# Patient Record
Sex: Female | Born: 1940 | Race: White | Hispanic: No | Marital: Married | Smoking: Never smoker
Health system: Southern US, Community
[De-identification: ages and names within clinical notes are randomized; demographics above are authoritative.]

## PROBLEM LIST (undated history)

## (undated) DIAGNOSIS — C801 Malignant (primary) neoplasm, unspecified: Secondary | ICD-10-CM

## (undated) DIAGNOSIS — I10 Essential (primary) hypertension: Secondary | ICD-10-CM

## (undated) DIAGNOSIS — C50919 Malignant neoplasm of unspecified site of unspecified female breast: Secondary | ICD-10-CM

## (undated) DIAGNOSIS — Z923 Personal history of irradiation: Secondary | ICD-10-CM

## (undated) DIAGNOSIS — E785 Hyperlipidemia, unspecified: Secondary | ICD-10-CM

## (undated) DIAGNOSIS — K635 Polyp of colon: Secondary | ICD-10-CM

## (undated) HISTORY — DX: Malignant (primary) neoplasm, unspecified: C80.1

## (undated) HISTORY — DX: Hyperlipidemia, unspecified: E78.5

## (undated) HISTORY — DX: Essential (primary) hypertension: I10

## (undated) HISTORY — DX: Polyp of colon: K63.5

## (undated) HISTORY — PX: BREAST LUMPECTOMY: SHX2

## (undated) HISTORY — PX: BREAST BIOPSY: SHX20

---

## 1998-08-10 ENCOUNTER — Other Ambulatory Visit: Admission: RE | Admit: 1998-08-10 | Discharge: 1998-08-10 | Payer: Self-pay | Admitting: Obstetrics and Gynecology

## 1998-11-19 ENCOUNTER — Ambulatory Visit (HOSPITAL_COMMUNITY): Admission: RE | Admit: 1998-11-19 | Discharge: 1998-11-19 | Payer: Self-pay | Admitting: Gastroenterology

## 1999-12-10 ENCOUNTER — Encounter: Payer: Self-pay | Admitting: Oncology

## 1999-12-10 ENCOUNTER — Encounter: Admission: RE | Admit: 1999-12-10 | Discharge: 1999-12-10 | Payer: Self-pay | Admitting: Oncology

## 2000-12-11 ENCOUNTER — Encounter: Payer: Self-pay | Admitting: Oncology

## 2000-12-11 ENCOUNTER — Encounter: Admission: RE | Admit: 2000-12-11 | Discharge: 2000-12-11 | Payer: Self-pay | Admitting: Oncology

## 2001-07-02 ENCOUNTER — Other Ambulatory Visit: Admission: RE | Admit: 2001-07-02 | Discharge: 2001-07-02 | Payer: Self-pay | Admitting: Family Medicine

## 2001-12-14 ENCOUNTER — Encounter: Admission: RE | Admit: 2001-12-14 | Discharge: 2001-12-14 | Payer: Self-pay | Admitting: Oncology

## 2001-12-14 ENCOUNTER — Encounter: Payer: Self-pay | Admitting: Oncology

## 2002-08-05 ENCOUNTER — Other Ambulatory Visit: Admission: RE | Admit: 2002-08-05 | Discharge: 2002-08-05 | Payer: Self-pay | Admitting: Family Medicine

## 2002-12-16 ENCOUNTER — Encounter: Admission: RE | Admit: 2002-12-16 | Discharge: 2002-12-16 | Payer: Self-pay | Admitting: Oncology

## 2002-12-16 ENCOUNTER — Encounter: Payer: Self-pay | Admitting: Oncology

## 2003-08-19 ENCOUNTER — Other Ambulatory Visit: Admission: RE | Admit: 2003-08-19 | Discharge: 2003-08-19 | Payer: Self-pay | Admitting: Family Medicine

## 2004-01-12 ENCOUNTER — Encounter: Admission: RE | Admit: 2004-01-12 | Discharge: 2004-01-12 | Payer: Self-pay | Admitting: Family Medicine

## 2004-02-02 ENCOUNTER — Ambulatory Visit (HOSPITAL_COMMUNITY): Admission: RE | Admit: 2004-02-02 | Discharge: 2004-02-02 | Payer: Self-pay | Admitting: Gastroenterology

## 2004-02-02 ENCOUNTER — Encounter (INDEPENDENT_AMBULATORY_CARE_PROVIDER_SITE_OTHER): Payer: Self-pay | Admitting: *Deleted

## 2005-01-28 ENCOUNTER — Encounter: Admission: RE | Admit: 2005-01-28 | Discharge: 2005-01-28 | Payer: Self-pay | Admitting: Family Medicine

## 2005-11-29 ENCOUNTER — Other Ambulatory Visit: Admission: RE | Admit: 2005-11-29 | Discharge: 2005-11-29 | Payer: Self-pay | Admitting: Family Medicine

## 2006-02-15 ENCOUNTER — Encounter: Admission: RE | Admit: 2006-02-15 | Discharge: 2006-02-15 | Payer: Self-pay | Admitting: Family Medicine

## 2007-02-19 ENCOUNTER — Encounter: Admission: RE | Admit: 2007-02-19 | Discharge: 2007-02-19 | Payer: Self-pay | Admitting: Family Medicine

## 2007-12-13 LAB — CONVERTED CEMR LAB: Pap Smear: NORMAL

## 2007-12-25 ENCOUNTER — Other Ambulatory Visit: Admission: RE | Admit: 2007-12-25 | Discharge: 2007-12-25 | Payer: Self-pay | Admitting: Family Medicine

## 2008-02-27 ENCOUNTER — Encounter: Admission: RE | Admit: 2008-02-27 | Discharge: 2008-02-27 | Payer: Self-pay | Admitting: Family Medicine

## 2009-02-19 ENCOUNTER — Encounter: Payer: Self-pay | Admitting: Family Medicine

## 2009-03-10 ENCOUNTER — Encounter: Admission: RE | Admit: 2009-03-10 | Discharge: 2009-03-10 | Payer: Self-pay | Admitting: Family Medicine

## 2010-02-09 ENCOUNTER — Ambulatory Visit: Payer: Self-pay | Admitting: Family Medicine

## 2010-02-09 DIAGNOSIS — E7800 Pure hypercholesterolemia, unspecified: Secondary | ICD-10-CM

## 2010-02-09 DIAGNOSIS — I10 Essential (primary) hypertension: Secondary | ICD-10-CM | POA: Insufficient documentation

## 2010-02-09 DIAGNOSIS — E559 Vitamin D deficiency, unspecified: Secondary | ICD-10-CM

## 2010-02-09 LAB — CONVERTED CEMR LAB: Vit D, 25-Hydroxy: 31 ng/mL (ref 30–89)

## 2010-02-10 LAB — CONVERTED CEMR LAB
BUN: 13 mg/dL (ref 6–23)
Calcium: 10.6 mg/dL — ABNORMAL HIGH (ref 8.4–10.5)
Creatinine, Ser: 0.8 mg/dL (ref 0.4–1.2)
HDL: 33.4 mg/dL — ABNORMAL LOW (ref >39.00)
LDL Cholesterol: 103 mg/dL — ABNORMAL HIGH (ref 0–99)
Total CHOL/HDL Ratio: 5
VLDL: 26.6 mg/dL (ref 0.0–40.0)

## 2010-02-15 ENCOUNTER — Ambulatory Visit: Payer: Self-pay | Admitting: Family Medicine

## 2010-02-15 DIAGNOSIS — Z860100 Personal history of colon polyps, unspecified: Secondary | ICD-10-CM | POA: Insufficient documentation

## 2010-02-15 DIAGNOSIS — L821 Other seborrheic keratosis: Secondary | ICD-10-CM

## 2010-02-15 DIAGNOSIS — M899 Disorder of bone, unspecified: Secondary | ICD-10-CM

## 2010-02-15 DIAGNOSIS — Z853 Personal history of malignant neoplasm of breast: Secondary | ICD-10-CM | POA: Insufficient documentation

## 2010-02-15 DIAGNOSIS — E785 Hyperlipidemia, unspecified: Secondary | ICD-10-CM

## 2010-02-15 DIAGNOSIS — M949 Disorder of cartilage, unspecified: Secondary | ICD-10-CM

## 2010-02-22 ENCOUNTER — Ambulatory Visit: Payer: Self-pay | Admitting: Family Medicine

## 2010-02-22 ENCOUNTER — Other Ambulatory Visit: Admission: RE | Admit: 2010-02-22 | Discharge: 2010-02-22 | Payer: Self-pay | Admitting: Family Medicine

## 2010-02-25 ENCOUNTER — Encounter: Payer: Self-pay | Admitting: Family Medicine

## 2010-02-25 LAB — CONVERTED CEMR LAB: Pap Smear: NEGATIVE

## 2010-03-08 ENCOUNTER — Encounter: Admission: RE | Admit: 2010-03-08 | Discharge: 2010-03-08 | Payer: Self-pay | Admitting: Family Medicine

## 2010-03-12 ENCOUNTER — Encounter: Admission: RE | Admit: 2010-03-12 | Discharge: 2010-03-12 | Payer: Self-pay | Admitting: Family Medicine

## 2010-03-12 ENCOUNTER — Encounter: Payer: Self-pay | Admitting: Family Medicine

## 2010-11-04 NOTE — Letter (Signed)
Summary: Dr.James Kindl's Records  Dr.James Kindl's Records   Imported By: Beau Fanny 02/17/2010 09:52:06  _____________________________________________________________________  External Attachment:    Type:   Image     Comment:   External Document

## 2010-11-04 NOTE — Letter (Signed)
Summary: Results Follow up Letter  [place] at [facility]  [address] [place], [place] [address]   Phone: [phone]  Fax: [phone]    03/12/2010 MRN: [id number]  [facility] [address] [place], [place]  [address]  Dear Heidi Sanchez,  The following are the results of your recent test(s):  Test         Result    Pap Smear:        Normal _____  Not Normal _____ Comments: ______________________________________________________ Cholesterol: LDL(Bad cholesterol):         Your goal is less than:         HDL (Good cholesterol):       Your goal is more than: Comments:  ______________________________________________________ Mammogram:        Normal __X___  Not Normal _____ Comments:  ___________________________________________________________________ Hemoccult:        Normal _____  Not normal _______ Comments:    _____________________________________________________________________ Other Tests:    We routinely do not discuss normal results over the telephone.  If you desire a copy of the results, or you have any questions about this information we can discuss them at your next office visit.   Sincerely,       Ruthe Mannan, MD

## 2010-11-04 NOTE — Assessment & Plan Note (Signed)
Summary: CPX PHYSICAL/JRR   Vital Signs:  Patient profile:   70 year old female Height:      63 inches Weight:      154.50 pounds BMI:     27.47 Temp:     97.9 degrees F oral Pulse rate:   72 / minute Pulse rhythm:   regular BP sitting:   142 / 84  (left arm) Cuff size:   regular  Vitals Entered By: Linde Gillis CMA Duncan Dull) (Feb 22, 2010 9:15 AM) CC: 30 minute exam   History of Present Illness: Here for Medicare AWV:  1.   Risk factors based on Past M, S, F history: hyperlipidemia, osteopenia, h/o breast CA.   2.   Physical Activities: very active, works out at [employer] almost every day, walks around neighborhood. 3.   Depression/mood: best she has felt in years, very relaxed.  Happy they have time to do the things they love. 4.   Hearing: see nursing assessment 5.   ADL's: independent for all ADLs, IADLs.   6.   Fall Risk: not a fall risk 7.   Home Safety: home is safe, one story. 8.   Height, weight, &visual acuity: see nursing assessment. 9.   Counseling: Discussed lab results. 10.   Labs ordered based on risk factors: labs ordered and discussed at office visit on Feb 24, 2010 11.           Referral Coordination- mammogram and DEXA scan set up for first week of June. 12.           Care Plan- discussed advance directive, husband is POA 13.            Cognitive Assessment- MMSE 30/30 (see Geriatric Assessment form).   Current Medications (verified): 1)  Simvastatin 40 Mg Tabs (Simvastatin) .... Take One Tablet By Mouth Once Daily 2)  Amlodipine Besylate 5 Mg Tabs (Amlodipine Besylate) .... Take One Tablet By Mouth Daily 3)  Alendronate Sodium 70 Mg Tabs (Alendronate Sodium) .... Take One Tablet By Mouth Once A Week 4)  Fluticasone Propionate 0.05 % Crea (Fluticasone Propionate) .... Use As Directed 5)  Multivitamins  Tabs (Multiple Vitamin) .... Take One Tablet By Mouth Daily 6)  Fish Oil 1000 Mg Caps (Omega-3 Fatty Acids) .... Take One Tablet By Mouth Once Daily 7)  Ocuvite  Preservision  Tabs (Multiple Vitamins-Minerals) .... Use As Directed 8)  Calcium 1200-1000 Mg-Unit Chew (Calcium Carbonate-Vit D-Min) .... Take One Tablet By Mouth Daily 9)  Vitamin D3 1000 Unit Tabs (Cholecalciferol) .... Take One Tablet By Mouth Daily 10)  Aspirin 81 Mg Chew (Aspirin) .... Take One Tablet By Mouth Once Daily 11)  Claritin 10 Mg Tabs (Loratadine) .... Take One Tablet By Mouth Daily For Allergies  Allergies: 1)  Codeine  Directives (verified): 1)  Husband Is Poa   Past History:  Past Medical History: Last updated: Feb 24, 2010 Breast cancer, hx of Hyperlipidemia Hypertension Colonic polyps, hx of  Past Surgical History: Last updated: 02/24/10 Lumpectomy  Family History: Last updated: 02-24-2010 Mom died at 83 of colon CA Dad died of 52 lung CA sister died at 9 of MI  Social History: Last updated: 02-24-10 Married Never Smoked Alcohol use-no Regular exercise-yes  Risk Factors: Exercise: yes (02-24-2010)  Risk Factors: Smoking Status: never (Feb 24, 2010)  Family History: Reviewed history from Feb 24, 2010 and no changes required. Mom died at 79 of colon CA Dad died of 48 lung CA sister died at 80 of MI  Social History: Reviewed history from  02/15/2010 and no changes required. Married Never Smoked Alcohol use-no Regular exercise-yes  Review of Systems      See HPI General:  Denies chills and fever. Eyes:  Denies blurring. ENT:  Denies decreased hearing and difficulty swallowing. CV:  Denies bluish discoloration of lips or nails and chest pain or discomfort. Resp:  Denies shortness of breath. GI:  Denies abdominal pain, bloody stools, and change in bowel habits. GU:  Denies abnormal vaginal bleeding. MS:  Denies joint pain, joint redness, and joint swelling. Derm:  Denies rash. Neuro:  Denies headaches. Psych:  Denies anxiety and depression. Endo:  Denies cold intolerance and heat intolerance.  Physical Exam  General:   Well-developed,well-nourished,in no acute distress; alert,appropriate and cooperative throughout examination Head:  normocephalic, atraumatic, and no abnormalities observed.   Eyes:  vision grossly intact, pupils equal, and pupils round.   Ears:  External ear exam shows no significant lesions or deformities.  Otoscopic examination reveals clear canals, tympanic membranes are intact bilaterally without bulging, retraction, inflammation or discharge. Hearing is grossly normal bilaterally. Nose:  no external deformity.   Mouth:  Oral mucosa and oropharynx without lesions or exudates.  Teeth in good repair. Neck:  No deformities, masses, or tenderness noted. Breasts:  No mass, nodules, thickening, tenderness, bulging, retraction, inflamation, nipple discharge or skin changes noted.   Lungs:  Normal respiratory effort, chest expands symmetrically. Lungs are clear to auscultation, no crackles or wheezes. Heart:  Normal rate and regular rhythm. S1 and S2 normal without gallop, murmur, click, rub or other extra sounds. Abdomen:  Bowel sounds positive,abdomen soft and non-tender without masses, organomegaly or hernias noted. Genitalia:  Pelvic Exam:        External: normal female genitalia without lesions or masses        Vagina: normal without lesions or masses        Cervix: normal without lesions or masses        Adnexa: normal bimanual exam without masses or fullness        Uterus: normal by palpation        Pap smear: performed Msk:  No deformity or scoliosis noted of thoracic or lumbar spine.   Extremities:  No clubbing, cyanosis, edema, or deformity noted with normal full range of motion of all joints.   Neurologic:  alert & oriented X3.   Skin:  Intact without suspicious lesions or rashes Psych:  Cognition and judgment appear intact. Alert and cooperative with normal attention span and concentration. No apparent delusions, illusions, hallucinations   Complete Medication List: 1)  Simvastatin  40 Mg Tabs (Simvastatin) .... Take one tablet by mouth once daily 2)  Amlodipine Besylate 5 Mg Tabs (Amlodipine besylate) .... Take one tablet by mouth daily 3)  Alendronate Sodium 70 Mg Tabs (Alendronate sodium) .... Take one tablet by mouth once a week 4)  Fluticasone Propionate 0.05 % Crea (Fluticasone propionate) .... Use as directed 5)  Multivitamins Tabs (Multiple vitamin) .... Take one tablet by mouth daily 6)  Fish Oil 1000 Mg Caps (Omega-3 fatty acids) .... Take one tablet by mouth once daily 7)  Ocuvite Preservision Tabs (Multiple vitamins-minerals) .... Use as directed 8)  Calcium 1200-1000 Mg-unit Chew (Calcium carbonate-vit d-min) .... Take one tablet by mouth daily 9)  Vitamin D3 1000 Unit Tabs (Cholecalciferol) .... Take one tablet by mouth daily 10)  Aspirin 81 Mg Chew (Aspirin) .... Take one tablet by mouth once daily 11)  Claritin 10 Mg Tabs (Loratadine) .... Take one tablet  by mouth daily for allergies  Other Orders: Obtaining Screening PAP Smear (B1478) First annual wellness visit with prevention plan  (G9562)  Current Allergies (reviewed today): CODEINE    Geriatric Assessment:  Activities of Daily Living:    Bathing-independent    Dressing-independent    Eating-independent    Toileting-independent    Transferring-independent    Continence-independent  Instrumental Activities of Daily Living:    Transportation-independent    Meal/Food Preparation-independent    Shopping Errands-independent    Housekeeping/Chores-independent    Money Management/Finances-independent    Medication Management-independent    Ability to Use Telephone-independent    Laundry-independent  Mental Status Exam: (value/max value)    Orientation to Time: 5/5    Orientation to Place: 5/5    Registration: 3/3    Attention/Calculation: 5/5    Recall: 3/3    Language-name 2 objects: 2/2    Language-repeat: 1/1    Language-follow 3-step command: 3/3    Language-read and follow  direction: 1/1    Write a sentence: 1/1    Copy design: 1/1 MSE Total score: 30/30  Balance: (value/max value)    Sitting balance: 1/1    Arises: 2/2    Attempts to arise: 2/2    Immediate standing balance: 2/2    Standing balance: 1/1    Nudged: 2/2    360 degree turn: 1/1    Sitting down: 2/2 Balance Total Score: 13/14  Gait: (value/max value)    Initiation of gait: 1/1    Step length-left: 1/1    Step height-left: 1/1    Step length-right: 1/1    Step height-right: 1/1    Step symmetry: 1/1    Step continuity: 1/1    Path: 2/2    Trunk: 2/2    Walking stance: 1/1 Gait Total Score: 12/12  Balance + Gait Total Score: 25/26  Appended Document: CPX PHYSICAL/JRR    Clinical Lists Changes  Observations: Added new observation of EYES COMMENT: Patient can not see out of her left eye good enough to due vision test.  Says the nerves in that eye are bad. (02/22/2010 10:00) Added new observation of VIS ACU OU: 40  (02/22/2010 10:00) Added new observation of VISUAL ACUIT: Done  (02/22/2010 10:00) Added new observation of VIS ACU OD: 40  (02/22/2010 10:00)       Vital Signs:  Patient Profile:   70 Years Old Female  Vision Screening: Right Eye w/o correction: 20 / 40 Both eyes w/o correction:  20/ 40  Vision Entered By: Linde Gillis CMA Duncan Dull) (Feb 22, 2010 10:02 AM)    Vision Screening:Right Eye w/o correction: 20 / 40 Both eyes w/o correction:  20/ 40       Vision Comments: Patient can not see out of her left eye good enough to due vision test.  Says the nerves in that eye are bad.  Vision Entered By: Linde Gillis CMA Duncan Dull) (Feb 22, 2010 10:03 AM)  20db HL: Left  500 hz: 25db 1000 hz: 25db 2000 hz: 25db 4000 hz: 25db Right  500 hz: 25db 1000 hz: 25db 2000 hz: 25db 4000 hz: 25db

## 2010-11-04 NOTE — Assessment & Plan Note (Signed)
Summary: NEW MEDICARE/[facility]   Vital Signs:  Patient profile:   70 year old female Height:      63 inches Weight:      153.38 pounds BMI:     27.27 Temp:     98.1 degrees F oral Pulse rate:   68 / minute Pulse rhythm:   regular BP sitting:   140 / 80  (left arm) Cuff size:   regular  Vitals Entered By: Linde Gillis CMA Duncan Dull) (03/01/10 1:54 PM) CC: new patient, establish care   History of Present Illness: 70 yo female here to establish care.  HTN- h/o white coat sydrome.  Bring in home daily log, BPs randing 116/68- 129/75.  Currently taking amlodipine 5 mg daily.  Does get occassional swelling in her feet from the amlodipine.  no HA, blurred vision, CP, SOB.  HLD- chronic low HDL, exercises 6 days per week.  HDL now 33 (up from 31 last year), LDL103, TG 133 (down from 222 last year).  Takes Simvastatin 40 mg and Fish oil 1000 mg daily.  Osteopenia- last bone density at least 2 years ago.  On Fosamax 50 mg weekly, Vit D 1000 International Units and Caltrate daily.  No fractures  H/o breast CA- 1998- s/p right lumpectomy and radiation.  No recurrence.  UTD on mammogram.  Preventive Screening-Counseling & Management  Alcohol-Tobacco     Smoking Status: never  Caffeine-Diet-Exercise     Does Patient Exercise: yes  Current Medications (verified): 1)  Simvastatin 40 Mg Tabs (Simvastatin) .... Take One Tablet By Mouth Once Daily 2)  Amlodipine Besylate 5 Mg Tabs (Amlodipine Besylate) .... Take One Tablet By Mouth Daily 3)  Alendronate Sodium 70 Mg Tabs (Alendronate Sodium) .... Take One Tablet By Mouth Once A Week 4)  Fluticasone Propionate 0.05 % Crea (Fluticasone Propionate) .... Use As Directed 5)  Multivitamins  Tabs (Multiple Vitamin) .... Take One Tablet By Mouth Daily 6)  Fish Oil 1000 Mg Caps (Omega-3 Fatty Acids) .... Take One Tablet By Mouth Once Daily 7)  Ocuvite Preservision  Tabs (Multiple Vitamins-Minerals) .... Use As Directed 8)  Calcium 1200-1000 Mg-Unit Chew  (Calcium Carbonate-Vit D-Min) .... Take One Tablet By Mouth Daily 9)  Vitamin D3 1000 Unit Tabs (Cholecalciferol) .... Take One Tablet By Mouth Daily 10)  Aspirin 81 Mg Chew (Aspirin) .... Take One Tablet By Mouth Once Daily 11)  Claritin 10 Mg Tabs (Loratadine) .... Take One Tablet By Mouth Daily For Allergies  Allergies (verified): 1)  Codeine  Past History:  Family History: Last updated: 2010-03-01 Mom died at 16 of colon CA Dad died of 47 lung CA sister died at 58 of MI  Social History: Last updated: 03/01/10 Married Never Smoked Alcohol use-no Regular exercise-yes  Risk Factors: Exercise: yes (Mar 01, 2010)  Risk Factors: Smoking Status: never (2010/03/01)  Past Medical History: Breast cancer, hx of Hyperlipidemia Hypertension Colonic polyps, hx of  Past Surgical History: Lumpectomy  Family History: Reviewed history and no changes required. Mom died at 73 of colon CA Dad died of 66 lung CA sister died at 58 of MI  Social History: Reviewed history and no changes required. Married Never Smoked Alcohol use-no Regular exercise-yes Smoking Status:  never Does Patient Exercise:  yes  Review of Systems      See HPI General:  Denies chills, fatigue, and fever. Eyes:  Denies blurring. ENT:  Denies difficulty swallowing. CV:  Denies chest pain or discomfort, difficulty breathing at night, and shortness of breath with  exertion. Resp:  Denies shortness of breath. GI:  Denies abdominal pain, bloody stools, and change in bowel habits. GU:  Denies abnormal vaginal bleeding, discharge, and dysuria. MS:  Denies joint pain, joint redness, and joint swelling. Derm:  Denies rash. Neuro:  Denies headaches. Psych:  Denies anxiety and depression. Endo:  Denies cold intolerance and heat intolerance. Heme:  Denies abnormal bruising and bleeding.  Physical Exam  General:  Well-developed,well-nourished,in no acute distress; alert,appropriate and cooperative throughout  examination Head:  normocephalic, atraumatic, and no abnormalities observed.   Eyes:  vision grossly intact, pupils equal, and pupils round.   Mouth:  good dentition.   Lungs:  Normal respiratory effort, chest expands symmetrically. Lungs are clear to auscultation, no crackles or wheezes. Heart:  Normal rate and regular rhythm. S1 and S2 normal without gallop, murmur, click, rub or other extra sounds. Abdomen:  Bowel sounds positive,abdomen soft and non-tender without masses, organomegaly or hernias noted. Extremities:  no edema Neurologic:  alert & oriented X3.   Skin:  seborrheic keratosis.   Psych:  memory intact for recent and remote, normally interactive, good eye contact, and not anxious appearing.     Impression & Recommendations:  Problem # 1:  HYPERLIPIDEMIA (ICD-272.4) Assessment Unchanged Doing well.  Advised to keep exercising and taking fish oil with her simvastatin.  Need to keep close eye on low HDL. Her updated medication list for this problem includes:    Simvastatin 40 Mg Tabs (Simvastatin) .Marland Kitchen... Take one tablet by mouth once daily  Problem # 2:  OSTEOPENIA (ICD-733.90) Assessment: Unchanged Need to repeat DEXA to see what the status is of her bone density and to make sure she is getting adequate treatment with fosamax.  Will palce referral. Her updated medication list for this problem includes:    Alendronate Sodium 70 Mg Tabs (Alendronate sodium) .Marland Kitchen... Take one tablet by mouth once a week    Calcium 1200-1000 Mg-unit Chew (Calcium carbonate-vit d-min) .Marland Kitchen... Take one tablet by mouth daily    Vitamin D3 1000 Unit Tabs (Cholecalciferol) .Marland Kitchen... 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  Discussed benign/genetic nature of them. Orders: Dermatology Referral (Derma)  Problem # 4:  COLONIC POLYPS, HX OF  (ICD-V12.72) Assessment: Unchanged UTD on colonoscopy.  Problem # 5:  BREAST CANCER, HX OF (ICD-V10.3) Assessment: Unchanged UTD on mammogram.  Problem # 6:  HYPERTENSION (ICD-401.9) Assessment: Unchanged Stable.  Continue Amlodipine 5 mg daily. Her updated medication list for this problem includes:    Amlodipine Besylate 5 Mg Tabs (Amlodipine besylate) .Marland Kitchen... Take one tablet by mouth daily  Complete Medication List: 1)  Simvastatin 40 Mg Tabs (Simvastatin) .... Take one tablet by mouth once daily 2)  Amlodipine Besylate 5 Mg Tabs (Amlodipine besylate) .... Take one tablet by mouth daily 3)  Alendronate Sodium 70 Mg Tabs (Alendronate sodium) .... Take one tablet by mouth once a week 4)  Fluticasone Propionate 0.05 % Crea (Fluticasone propionate) .... Use as directed 5)  Multivitamins Tabs (Multiple vitamin) .... Take one tablet by mouth daily 6)  Fish Oil 1000 Mg Caps (Omega-3 fatty acids) .... Take one tablet by mouth once daily 7)  Ocuvite Preservision Tabs (Multiple vitamins-minerals) .... Use as directed 8)  Calcium 1200-1000 Mg-unit Chew (Calcium carbonate-vit d-min) .... Take one tablet by mouth daily 9)  Vitamin D3 1000 Unit Tabs (Cholecalciferol) .... Take one tablet by  mouth daily 10)  Aspirin 81 Mg Chew (Aspirin) .... Take one tablet by mouth once daily 11)  Claritin 10 Mg Tabs (Loratadine) .... Take one tablet by mouth daily for allergies  Other Orders: Zoster (Shingles) Vaccine Live [phone]) Admin 1st Vaccine ([id number])  Patient Instructions: 1)  Great to meet you. 2)  Please stop by to see Shirlee Limerick on your way out to set up your DEXA scan. 3)  Make an appointment for complete physical/pap smear on your way out.  Current Allergies (reviewed today): CODEINE   Immunizations Administered:  Zostavax # 1:    Vaccine Type: Zostavax    Site: left deltoid    Mfr: Merck    Dose: 0.67ml    Route: Severy    Given by: Linde Gillis CMA (AAMA)    Exp. Date: 03/12/2011    Lot #:  [id number]    VIS given: 07/15/05 given Feb 15, 2010.   TD Result Date:  02/11/2009 TD Result:  historical Pneumovax Result Date:  12/19/2007 Pneumovax Result:  hisotrical Herpes Zoster Result Date:  02/15/2010 Herpes Zoster Result:  given Flex Sig Next Due:  Not Indicated Colonoscopy Result Date:  05/21/2008 Colonoscopy Result:  polyps Colonoscopy Next Due:  5 yr Hemoccult Next Due:  Not Indicated PAP Result Date:  12/13/2007 PAP Result:  normal PAP Next Due:  3 yr Mammogram Result Date:  03/10/2009 Mammogram Result:  normal Mammogram Next Due:  1 yr

## 2010-11-04 NOTE — Letter (Signed)
Summary: Results Follow up Letter  [place] at [facility]  [address] [place], [place] [address]   Phone: [phone]  Fax: [phone]    02/25/2010 MRN: [id number]  [facility] [address] [place], [place]  [address]  Dear Ms. Stickle,  The following are the results of your recent test(s):  Test         Result    Pap Smear:        Normal __X___  Not Normal _____ Comments: ______________________________________________________ Cholesterol: LDL(Bad cholesterol):         Your goal is less than:         HDL (Good cholesterol):       Your goal is more than: Comments:  ______________________________________________________ Mammogram:        Normal _____  Not Normal _____ Comments:  ___________________________________________________________________ Hemoccult:        Normal _____  Not normal _______ Comments:    _____________________________________________________________________ Other Tests:    We routinely do not discuss normal results over the telephone.  If you desire a copy of the results, or you have any questions about this information we can discuss them at your next office visit.   Sincerely,        Ruthe Mannan, MD

## 2011-02-15 ENCOUNTER — Other Ambulatory Visit: Payer: Self-pay | Admitting: Family Medicine

## 2011-02-15 DIAGNOSIS — Z1231 Encounter for screening mammogram for malignant neoplasm of breast: Secondary | ICD-10-CM

## 2011-02-18 NOTE — Op Note (Signed)
NAME:  Heidi Sanchez, Heidi Sanchez                          ACCOUNT NO.:  [id number]   MEDICAL RECORD NO.:  [id number]                   PATIENT TYPE:  AMB   LOCATION:  ENDO                                 FACILITY:  [facility]   PHYSICIAN:  Petra Kuba, M.D.                 DATE OF BIRTH:  [birth year]   DATE OF PROCEDURE:  02/02/2004  DATE OF DISCHARGE:                                 OPERATIVE REPORT   PROCEDURE:  Colonoscopy with polypectomy.   INDICATIONS:  History of colon polyps, due for repeat screening.  Consent  was signed after risks, benefits, methods, and options thoroughly discussed  in the office in the past.   MEDICINES USED:  Demerol 70 mg, Versed 7 mg.   DESCRIPTION OF PROCEDURE:  Rectal inspection was pertinent for external  hemorrhoids, small.  Digital exam is negative.  The pediatric video  adjustable colonoscope was inserted, fairly easily advanced around the colon  to the cecum.  This did require some abdominal pressure but no position  changes.  No obvious abnormality was seen on insertion.  The cecum was  identified by the appendiceal orifice and the ileocecal valve.  Prep was  adequate.  There was some liquid stool that required washing and suctioning  in the cecal pole.  Three tiny questionable polyps were seen and were all  cold biopsied x1 or 2 and put in the first container.  The scope was slowly  withdrawn.  Ascending and transverse were normal.  In the proximal level of  the splenic flexure two tiny polyps were seen and were each hot biopsied x1  or 2 and put in a second container.  The scope was further withdrawn.  No  additional findings were seen as we slowly withdrew back to the rectum.  Anorectal pull-through and retroflexion confirmed some small hemorrhoids.  The scope was reinserted a short way up the left side of the colon, air was  suctioned, the scope removed.  The patient tolerated the procedure well.  There was no immediate complication.   ENDOSCOPIC  DIAGNOSES:  1. Internal-external small hemorrhoids.  2. Two tiny splenic flexure polyps, hot biopsied.  3. Cecal questionable three tiny polyps, cold biopsied.  4. Otherwise within normal limits to the cecum.   PLAN:  Await pathology to determine future colonic screening.  Happy to see  back p.r.n., otherwise return care to Dr. Artis Flock for the customary health  care maintenance to include yearly rectals and guaiacs.                                               Petra Kuba, M.D.    MEM/MEDQ  D:  02/02/2004  T:  02/02/2004  Job:  [id number]   cc:   Quita Skye. Artis Flock, M.D.  [address], [address]  [place]  [place] [address]  Fax: [phone]

## 2011-03-14 ENCOUNTER — Ambulatory Visit
Admission: RE | Admit: 2011-03-14 | Discharge: 2011-03-14 | Disposition: A | Discharge status: Home / Self Care | Payer: Medicare Other | Source: Ambulatory Visit | Attending: Family Medicine | Admitting: Family Medicine

## 2011-03-14 DIAGNOSIS — Z1231 Encounter for screening mammogram for malignant neoplasm of breast: Secondary | ICD-10-CM

## 2011-03-15 ENCOUNTER — Encounter: Payer: Self-pay | Admitting: *Deleted

## 2011-03-17 ENCOUNTER — Other Ambulatory Visit: Payer: Self-pay | Admitting: *Deleted

## 2011-03-17 MED ORDER — AMLODIPINE BESYLATE 5 MG PO TABS: 5.0000 mg | ORAL_TABLET | Freq: Every day | ORAL | Status: DC

## 2011-03-18 ENCOUNTER — Other Ambulatory Visit: Payer: Self-pay | Admitting: *Deleted

## 2011-03-18 MED ORDER — AMLODIPINE BESYLATE 5 MG PO TABS: 5.0000 mg | ORAL_TABLET | Freq: Every day | ORAL | Status: DC

## 2011-04-07 ENCOUNTER — Other Ambulatory Visit: Payer: Self-pay | Admitting: *Deleted

## 2011-04-07 MED ORDER — ALENDRONATE SODIUM 70 MG PO TABS: 70.0000 mg | ORAL_TABLET | ORAL | Status: DC

## 2011-04-12 ENCOUNTER — Other Ambulatory Visit: Payer: Self-pay | Admitting: *Deleted

## 2011-04-12 MED ORDER — SIMVASTATIN 40 MG PO TABS: 40.0000 mg | ORAL_TABLET | Freq: Every day | ORAL | Status: DC

## 2011-04-19 ENCOUNTER — Other Ambulatory Visit: Payer: Self-pay | Admitting: Family Medicine

## 2011-04-19 DIAGNOSIS — I10 Essential (primary) hypertension: Secondary | ICD-10-CM

## 2011-04-19 DIAGNOSIS — E7800 Pure hypercholesterolemia, unspecified: Secondary | ICD-10-CM

## 2011-04-19 DIAGNOSIS — E559 Vitamin D deficiency, unspecified: Secondary | ICD-10-CM

## 2011-04-21 ENCOUNTER — Encounter: Payer: Self-pay | Admitting: Family Medicine

## 2011-04-21 ENCOUNTER — Other Ambulatory Visit (INDEPENDENT_AMBULATORY_CARE_PROVIDER_SITE_OTHER): Payer: Medicare Other | Admitting: Family Medicine

## 2011-04-21 DIAGNOSIS — E559 Vitamin D deficiency, unspecified: Secondary | ICD-10-CM

## 2011-04-21 DIAGNOSIS — I10 Essential (primary) hypertension: Secondary | ICD-10-CM

## 2011-04-21 DIAGNOSIS — E7800 Pure hypercholesterolemia, unspecified: Secondary | ICD-10-CM

## 2011-04-21 LAB — HM MAMMOGRAPHY

## 2011-04-21 LAB — LIPID PANEL: HDL: 34.4 mg/dL — ABNORMAL LOW (ref >39.00)

## 2011-04-21 LAB — BASIC METABOLIC PANEL
Calcium: 10.1 mg/dL (ref 8.4–10.5)
Creatinine, Ser: 0.8 mg/dL (ref 0.4–1.2)

## 2011-04-21 LAB — HM SIGMOIDOSCOPY

## 2011-04-21 LAB — HM COLONOSCOPY

## 2011-04-22 LAB — VITAMIN D 25 HYDROXY (VIT D DEFICIENCY, FRACTURES): Vit D, 25-Hydroxy: 37 ng/mL (ref 30–89)

## 2011-04-26 ENCOUNTER — Encounter: Payer: Self-pay | Admitting: Family Medicine

## 2011-04-26 ENCOUNTER — Ambulatory Visit (INDEPENDENT_AMBULATORY_CARE_PROVIDER_SITE_OTHER): Payer: Medicare Other | Admitting: Family Medicine

## 2011-04-26 VITALS — BP 140/80 | HR 66 | Temp 97.9°F | Ht 62.75 in | Wt 155.5 lb

## 2011-04-26 DIAGNOSIS — E7800 Pure hypercholesterolemia, unspecified: Secondary | ICD-10-CM

## 2011-04-26 DIAGNOSIS — M949 Disorder of cartilage, unspecified: Secondary | ICD-10-CM

## 2011-04-26 DIAGNOSIS — I10 Essential (primary) hypertension: Secondary | ICD-10-CM

## 2011-04-26 DIAGNOSIS — Z0000 Encounter for general adult medical examination without abnormal findings: Secondary | ICD-10-CM

## 2011-04-26 LAB — HM DEXA SCAN

## 2011-04-26 MED ORDER — DIPHENHYDRAMINE HCL (SLEEP) 25 MG PO TABS: 25.0000 mg | ORAL_TABLET | Freq: Every evening | ORAL | Status: DC | PRN

## 2011-04-26 MED ORDER — VITAMIN D3 25 MCG (1000 UT) PO CAPS: 1.0000 | ORAL_CAPSULE | Freq: Every day | ORAL | Status: DC

## 2011-04-26 MED ORDER — SIMVASTATIN 40 MG PO TABS: 40.0000 mg | ORAL_TABLET | Freq: Every day | ORAL | Status: DC

## 2011-04-26 MED ORDER — FLUTICASONE PROPIONATE 50 MCG/ACT NA SUSP: 2.0000 | Freq: Every day | NASAL | Status: DC

## 2011-04-26 MED ORDER — CALCIUM 1200-1000 MG-UNIT PO CHEW: 1.0000 | CHEWABLE_TABLET | Freq: Every day | ORAL | Status: DC

## 2011-04-26 MED ORDER — AMLODIPINE BESYLATE 5 MG PO TABS: 5.0000 mg | ORAL_TABLET | Freq: Every day | ORAL | Status: DC

## 2011-04-26 MED ORDER — FISH OIL 1000 MG PO CAPS: 1.0000 | ORAL_CAPSULE | Freq: Every day | ORAL | Status: AC

## 2011-04-26 MED ORDER — LORATADINE 10 MG PO TABS: 10.0000 mg | ORAL_TABLET | Freq: Every day | ORAL | Status: DC

## 2011-04-26 NOTE — Patient Instructions (Signed)
  Try to get most or all of your calcium from your food--aim for 1000 mg/day for women up to 50 and men up to 70 and 1200 mg/day for women over 50 and men over 70.  To figure out dietary calcium: 300 mg/day from all non dairy foods plus 300 mg per cup of milk, other dairy, or fortified juice.  Non dairy foods that contain calcium:  Kale, oranges, sardines, oatmeal, soy milk/soybeans, salmon, white beans, dried figs, turnip greens, almonds, broccoli, tofu.

## 2011-04-26 NOTE — Progress Notes (Signed)
70 yo here for annual wellness visit.   HTN- h/o white coat sydrome.  Bring in home daily log, BPs randing 116/69- 139/78.  Currently taking amlodipine 5 mg daily.  Does get occassional swelling in her feet from the amlodipine.  no HA, blurred vision, CP, SOB.  HLD- chronic low HDL, exercises 6 days per week.  Takes Simvastatin 40 mg and Fish oil 1000 mg daily. Lab Results  Component Value Date   CHOL 175 04/21/2011   CHOL 163 02/09/2010   Lab Results  Component Value Date   HDL 34.40* 04/21/2011   HDL 33.40* 02/09/2010   Lab Results  Component Value Date   LDLCALC 102* 04/21/2011   LDLCALC 103* 02/09/2010   Lab Results  Component Value Date   TRIG 195.0* 04/21/2011   TRIG 133.0 02/09/2010    Osteopenia- last bone density 03/2010 was normal!   On Fosamax 50 mg weekly, Vit D 1000 International Units and Caltrate daily.  No fractures, has been on Fosamax for almost 5 years.  H/o breast CA- 1998- s/p right lumpectomy and radiation.  No recurrence.  UTD on mammogram.  I have personally reviewed the Medicare Annual Wellness questionnaire and have noted 1. The patient's medical and social history 2. Their use of alcohol, tobacco or illicit drugs 3. Their current medications and supplements 4. The patient's functional ability including ADL's, fall risks, home safety risks and hearing or visual             impairment. 5. Diet and physical activities 6. Evidence for depression or mood disorders  Patient Active Problem List  Diagnoses  . VITAMIN D DEFICIENCY  . PURE HYPERCHOLESTEROLEMIA  . HYPERLIPIDEMIA  . HYPERTENSION  . SEBORRHEIC KERATOSIS  . OSTEOPENIA  . BREAST CANCER, HX OF  . COLONIC POLYPS, HX OF   Past Medical History  Diagnosis Date  . Cancer     breast  . Hyperlipidemia   . Hypertension   . Colonic polyp    Past Surgical History  Procedure Date  . Breast lumpectomy    History  Substance Use Topics  . Smoking status: Never Smoker   . Smokeless tobacco: Not on  file  . Alcohol Use: No   Family History  Problem Relation Age of Onset  . Cancer Mother   . Cancer Father    Allergies  Allergen Reactions  . Codeine     REACTION: Nigthmares   Current Outpatient Prescriptions on File Prior to Visit  Medication Sig Dispense Refill  . alendronate (FOSAMAX) 70 MG tablet Take 1 tablet (70 mg total) by mouth every 7 (seven) days. Take with a full glass of water on an empty stomach.  4 tablet  4  . amLODipine (NORVASC) 5 MG tablet Take 1 tablet (5 mg total) by mouth daily.  30 tablet  0  . aspirin 81 MG tablet Take 81 mg by mouth daily.        . Calcium 1200-1000 MG-UNIT CHEW Chew 1 capsule by mouth daily.        . Cholecalciferol (VITAMIN D3) 1000 UNITS CAPS Take 1 capsule by mouth daily.        . fluticasone (CUTIVATE) 0.05 % cream Use as directed       . loratadine (CLARITIN) 10 MG tablet Take 10 mg by mouth daily.        . Multiple Vitamin (MULTIVITAMIN) capsule Take 1 capsule by mouth daily.        . Multiple Vitamins-Minerals (OCUVITE PRESERVISION) TABS  Use as directed       . Omega-3 Fatty Acids (FISH OIL) 1000 MG CAPS Take 1 capsule by mouth daily.        . simvastatin (ZOCOR) 40 MG tablet Take 1 tablet (40 mg total) by mouth at bedtime.  90 tablet  0   The PMH, PSH, Social History, Family History, Medications, and allergies have been reviewed in [facility], and have been updated if relevant.  Review of Systems       See HPI General:  Denies chills, fatigue, and fever. Eyes:  Denies blurring. ENT:  Denies difficulty swallowing. CV:  Denies chest pain or discomfort, difficulty breathing at night, and shortness of breath with exertion. Resp:  Denies shortness of breath. GI:  Denies abdominal pain, bloody stools, and change in bowel habits. GU:  Denies abnormal vaginal bleeding, discharge, and dysuria. MS:  Denies joint pain, joint redness, and joint swelling. Derm:  Denies rash. Neuro:  Denies headaches. Psych:  Denies anxiety and  depression. Endo:  Denies cold intolerance and heat intolerance. Heme:  Denies abnormal bruising and bleeding.  Physical Exam BP 140/80  Pulse 66  Temp(Src) 97.9 F (36.6 C) (Oral)  Ht 5' 2.75" (1.594 m)  Wt 155 lb 8 oz (70.534 kg)  BMI 27.77 kg/m2  General:  Well-developed,well-nourished,in no acute distress; alert,appropriate and cooperative throughout examination Head:  normocephalic, atraumatic, and no abnormalities observed.   Eyes:  vision grossly intact, pupils equal, and pupils round.   Mouth:  good dentition.   Lungs:  Normal respiratory effort, chest expands symmetrically. Lungs are clear to auscultation, no crackles or wheezes. Heart:  Normal rate and regular rhythm. S1 and S2 normal without gallop, murmur, click, rub or other extra sounds. Abdomen:  Bowel sounds positive,abdomen soft and non-tender without masses, organomegaly or hernias noted. Extremities:  no edema Neurologic:  alert & oriented X3.   Skin:  seborrheic keratosis.   Psych:  memory intact for recent and remote, normally interactive, good eye contact, and not anxious appearing.    Assessment and Plan: 1. Routine general medical examination at a health care facility   The patients weight, height, BMI and visual acuity have been recorded in the chart I have made referrals, counseling and provided education to the patient based review of the above and I have provided the pt with a written personalized care plan for preventive services.   2. Pure hypercholesterolemia  Stable.  Continue current dose of Simvastatin, continue weight bearing exercises.   3. OSTEOPENIA  Improved.  Will d/c Fosamax. Plan to repeat DEXA in 1 year.   4. HYPERTENSION         Stable.  Known h/o white coat HTN.  Continue current dose of Amlodipine.

## 2011-04-28 ENCOUNTER — Other Ambulatory Visit: Payer: Self-pay | Admitting: Family Medicine

## 2011-04-28 ENCOUNTER — Other Ambulatory Visit: Payer: Medicare Other

## 2011-04-28 ENCOUNTER — Encounter: Payer: Self-pay | Admitting: *Deleted

## 2011-04-28 DIAGNOSIS — Z1211 Encounter for screening for malignant neoplasm of colon: Secondary | ICD-10-CM

## 2011-04-28 LAB — FECAL OCCULT BLOOD, IMMUNOCHEMICAL: Fecal Occult Bld: NEGATIVE

## 2011-06-15 ENCOUNTER — Encounter: Payer: Self-pay | Admitting: Family Medicine

## 2011-06-15 ENCOUNTER — Ambulatory Visit (INDEPENDENT_AMBULATORY_CARE_PROVIDER_SITE_OTHER): Payer: Medicare Other | Admitting: Family Medicine

## 2011-06-15 DIAGNOSIS — J40 Bronchitis, not specified as acute or chronic: Secondary | ICD-10-CM

## 2011-06-15 MED ORDER — HYDROCOD POLST-CHLORPHEN POLST 10-8 MG/5ML PO LQCR: 5.0000 mL | Freq: Every evening | ORAL | Status: DC | PRN

## 2011-06-15 NOTE — Assessment & Plan Note (Signed)
4d hx sxs, likely viral. Supportive care for now, tussionex for cough at night in case robitussin not helping. Discussed red flags to notify us for abx (fever >101, worsening cough or SOB). Pt agrees with plan.

## 2011-06-15 NOTE — Patient Instructions (Signed)
Sounds like you have a viral upper respiratory infection. Antibiotics are not needed for this.  Viral infections usually take 7-10 days to resolve.  The cough can last 4 weeks to go away. Use medication as prescribed: tussionex in case OTC cough med not helping. Push fluids and plenty of rest. Please return if you are not improving as expected, or if you have high fevers (>101.5) or difficulty swallowing or worsening productive cough. Call clinic with questions.  Pleasure to see you today.

## 2011-06-15 NOTE — Progress Notes (Signed)
  Subjective:    Patient ID: Verlee Rossetti, female    DOB: 1941/01/20, 70 y.o.   MRN: [id number]  HPI CC: URI?  4d h/o sudden onset congestion, cough worse at night productive of yellow sputum.  Has been taking benadryl to control sxs as well as ibuprofen.  Tried bourboun, gingerale.  Tried nothing else so far.  Increased appetite.  + R ear ache prior to sxs starting last week.  No fever, chill, abd pain, n/v/d, rashes, myalgias, arthralgias.  No HA or tooth pain.  Cut vacation at [facility] short  Was exposed to granddaughter who was sick.  No smokers at home.  No h/o asthma/COPD.  Review of Systems Per HPI    Objective:   Physical Exam  Nursing note and vitals reviewed. Constitutional: She appears well-developed and well-nourished. No distress.  HENT:  Head: Normocephalic and atraumatic.  Right Ear: Hearing, tympanic membrane, external ear and ear canal normal.  Left Ear: Hearing, tympanic membrane, external ear and ear canal normal.  Nose: Nose normal. No mucosal edema or rhinorrhea. Right sinus exhibits no maxillary sinus tenderness and no frontal sinus tenderness. Left sinus exhibits no maxillary sinus tenderness and no frontal sinus tenderness.  Mouth/Throat: Uvula is midline, oropharynx is clear and moist and mucous membranes are normal. No oropharyngeal exudate, posterior oropharyngeal edema, posterior oropharyngeal erythema or tonsillar abscesses.  Eyes: Conjunctivae and EOM are normal. Pupils are equal, round, and reactive to light. No scleral icterus.  Neck: Normal range of motion. Neck supple.  Cardiovascular: Normal rate, regular rhythm, normal heart sounds and intact distal pulses.   No murmur heard. Pulmonary/Chest: Effort normal and breath sounds normal. No respiratory distress. She has no wheezes. She has no rales.  Lymphadenopathy:    She has no cervical adenopathy.  Skin: Skin is warm and dry. No rash noted.  Psychiatric: She has a normal mood and affect.           Assessment & Plan:

## 2011-07-19 ENCOUNTER — Other Ambulatory Visit: Payer: Self-pay | Admitting: *Deleted

## 2011-07-19 MED ORDER — SIMVASTATIN 40 MG PO TABS: 40.0000 mg | ORAL_TABLET | Freq: Every day | ORAL | Status: DC

## 2012-01-20 DIAGNOSIS — L719 Rosacea, unspecified: Secondary | ICD-10-CM | POA: Diagnosis not present

## 2012-02-06 ENCOUNTER — Other Ambulatory Visit: Payer: Self-pay | Admitting: Family Medicine

## 2012-02-06 DIAGNOSIS — Z1231 Encounter for screening mammogram for malignant neoplasm of breast: Secondary | ICD-10-CM

## 2012-02-07 DIAGNOSIS — H353290 Exudative age-related macular degeneration, unspecified eye, stage unspecified: Secondary | ICD-10-CM | POA: Diagnosis not present

## 2012-03-09 ENCOUNTER — Encounter: Payer: Self-pay | Admitting: Family Medicine

## 2012-03-09 ENCOUNTER — Telehealth: Payer: Self-pay

## 2012-03-09 ENCOUNTER — Ambulatory Visit (INDEPENDENT_AMBULATORY_CARE_PROVIDER_SITE_OTHER): Payer: Medicare Other | Admitting: Family Medicine

## 2012-03-09 VITALS — BP 160/90 | HR 88 | Temp 97.9°F | Wt 153.0 lb

## 2012-03-09 DIAGNOSIS — B372 Candidiasis of skin and nail: Secondary | ICD-10-CM | POA: Insufficient documentation

## 2012-03-09 MED ORDER — NYSTATIN-TRIAMCINOLONE 100000-0.1 UNIT/GM-% EX OINT: TOPICAL_OINTMENT | Freq: Two times a day (BID) | CUTANEOUS | Status: AC

## 2012-03-09 NOTE — Patient Instructions (Signed)
I think you have a yeast infection. Use the mycolog twice daily. Let's top that aveeno body wash. Call me on Monday if not any better.

## 2012-03-09 NOTE — Telephone Encounter (Signed)
Rash for 3 weeks in between buttocks, this AM after walking on treadmill, rash spread toward vagina; red, itchy and burning. Recently used Aveeno lavendar body wash and perineal wipes; has stopped using but rash worsened this AM. No fever, no vaginal discharge. Pt to see Dr Dayton Martes today 4 pm.

## 2012-03-09 NOTE — Progress Notes (Signed)
Subjective:    Patient ID: Heidi Sanchez, female    DOB: September 07, 1941, 71 y.o.   MRN: [id number]  HPI  Very pleasant 71 yo female here for rash in her rectal area.  Changed to a new aveeno body wash, and felt irritation in around her rectum since. Has been excerzing more and sweating a lot these past few weeks. Taking benadryl but that is not helping with the itching.  No pain with defecation. No blood in stool.  No vaginal discharge or vaginal irritation.  Patient Active Problem List  Diagnoses  . VITAMIN D DEFICIENCY  . PURE HYPERCHOLESTEROLEMIA  . HYPERLIPIDEMIA  . HYPERTENSION  . SEBORRHEIC KERATOSIS  . OSTEOPENIA  . BREAST CANCER, HX OF  . COLONIC POLYPS, HX OF  . Routine general medical examination at a health care facility  . Bronchitis  . Candidal skin infection   Past Medical History  Diagnosis Date  . Cancer     breast  . Hyperlipidemia   . Hypertension   . Colonic polyp    Past Surgical History  Procedure Date  . Breast lumpectomy    History  Substance Use Topics  . Smoking status: Never Smoker   . Smokeless tobacco: Not on file  . Alcohol Use: No   Family History  Problem Relation Age of Onset  . Cancer Mother   . Cancer Father    Allergies  Allergen Reactions  . Codeine     REACTION: Nigthmares   Current Outpatient Prescriptions on File Prior to Visit  Medication Sig Dispense Refill  . amLODipine (NORVASC) 5 MG tablet Take 1 tablet (5 mg total) by mouth daily.  90 tablet  3  . aspirin 81 MG tablet Take 81 mg by mouth every other day.       . Calcium 1200-1000 MG-UNIT CHEW Chew 1 capsule by mouth daily.  90 each  3  . chlorpheniramine-HYDROcodone (TUSSIONEX) 10-8 MG/5ML LQCR Take 5 mLs by mouth at bedtime as needed. Sedation precautions  140 mL  0  . Cholecalciferol (VITAMIN D3) 1000 UNITS CAPS Take 1 capsule by mouth daily.  90 capsule  3  . diphenhydrAMINE (SOMINEX) 25 MG tablet Take 1 tablet (25 mg total) by mouth at bedtime as needed.   90 tablet  3  . fluticasone (FLONASE) 50 MCG/ACT nasal spray Place 2 sprays into the nose daily.  16 g  11  . loratadine (CLARITIN) 10 MG tablet Take 1 tablet (10 mg total) by mouth daily.  90 tablet  3  . Multiple Vitamin (MULTIVITAMIN) capsule Take 1 capsule by mouth daily.        . Multiple Vitamins-Minerals (OCUVITE PRESERVISION) TABS 1 tablet 2 (two) times daily. Use as directed      . Omega-3 Fatty Acids (FISH OIL) 1000 MG CAPS Take 1 capsule (1,000 mg total) by mouth daily.  90 capsule  3  . simvastatin (ZOCOR) 40 MG tablet Take 1 tablet (40 mg total) by mouth at bedtime.  90 tablet  3   The PMH, PSH, Social History, Family History, Medications, and allergies have been reviewed in [facility], and have been updated if relevant.  Review of Systems See HPI    Objective:   Physical Exam BP 160/90  Pulse 88  Temp(Src) 97.9 F (36.6 C) (Oral)  Wt 153 lb (69.4 kg)  General:  Well-developed,well-nourished,in no acute distress; alert,appropriate and cooperative throughout examination Rectal:   Diffuse erythema in gluteal folds with satellite lesions Psych:  Cognition and judgment  appear intact. Alert and cooperative with normal attention span and concentration. No apparent delusions, illusions, hallucinations     Assessment & Plan:   1. Candidal skin infection    New- advised trying to change out of wet clothes immediately after her work outs. Mycolog twice daily until area clears. She will call next week with an update. The patient indicates understanding of these issues and agrees with the plan.

## 2012-03-12 ENCOUNTER — Ambulatory Visit: Payer: Medicare Other | Admitting: Family Medicine

## 2012-03-30 ENCOUNTER — Ambulatory Visit
Admission: RE | Admit: 2012-03-30 | Discharge: 2012-03-30 | Disposition: A | Discharge status: Home / Self Care | Payer: Medicare Other | Source: Ambulatory Visit | Attending: Family Medicine | Admitting: Family Medicine

## 2012-03-30 DIAGNOSIS — Z1231 Encounter for screening mammogram for malignant neoplasm of breast: Secondary | ICD-10-CM | POA: Diagnosis not present

## 2012-04-02 ENCOUNTER — Encounter: Payer: Self-pay | Admitting: *Deleted

## 2012-04-12 ENCOUNTER — Other Ambulatory Visit: Payer: Self-pay | Admitting: Family Medicine

## 2012-04-24 ENCOUNTER — Other Ambulatory Visit: Payer: Self-pay | Admitting: Family Medicine

## 2012-04-24 DIAGNOSIS — I10 Essential (primary) hypertension: Secondary | ICD-10-CM

## 2012-04-24 DIAGNOSIS — E7800 Pure hypercholesterolemia, unspecified: Secondary | ICD-10-CM

## 2012-04-24 DIAGNOSIS — Z0000 Encounter for general adult medical examination without abnormal findings: Secondary | ICD-10-CM

## 2012-04-24 DIAGNOSIS — E559 Vitamin D deficiency, unspecified: Secondary | ICD-10-CM

## 2012-04-30 ENCOUNTER — Other Ambulatory Visit (INDEPENDENT_AMBULATORY_CARE_PROVIDER_SITE_OTHER): Payer: Medicare Other

## 2012-04-30 DIAGNOSIS — Z0000 Encounter for general adult medical examination without abnormal findings: Secondary | ICD-10-CM

## 2012-04-30 DIAGNOSIS — E7800 Pure hypercholesterolemia, unspecified: Secondary | ICD-10-CM | POA: Diagnosis not present

## 2012-04-30 LAB — COMPREHENSIVE METABOLIC PANEL
AST: 19 U/L (ref 0–37)
BUN: 12 mg/dL (ref 6–23)
Calcium: 10.1 mg/dL (ref 8.4–10.5)
Chloride: 105 mEq/L (ref 96–112)
Creatinine, Ser: 0.8 mg/dL (ref 0.4–1.2)
Glucose, Bld: 97 mg/dL (ref 70–99)

## 2012-04-30 LAB — LIPID PANEL
Cholesterol: 165 mg/dL (ref 0–200)
HDL: 34 mg/dL — ABNORMAL LOW (ref >39.00)
Total CHOL/HDL Ratio: 5
Triglycerides: 111 mg/dL (ref 0.0–149.0)

## 2012-05-07 ENCOUNTER — Encounter: Payer: Self-pay | Admitting: Family Medicine

## 2012-05-07 ENCOUNTER — Ambulatory Visit (INDEPENDENT_AMBULATORY_CARE_PROVIDER_SITE_OTHER): Payer: Medicare Other | Admitting: Family Medicine

## 2012-05-07 ENCOUNTER — Encounter: Payer: Medicare Other | Admitting: Family Medicine

## 2012-05-07 VITALS — BP 144/90 | HR 72 | Temp 98.1°F | Ht 63.0 in | Wt 153.0 lb

## 2012-05-07 DIAGNOSIS — Z780 Asymptomatic menopausal state: Secondary | ICD-10-CM

## 2012-05-07 DIAGNOSIS — I10 Essential (primary) hypertension: Secondary | ICD-10-CM

## 2012-05-07 DIAGNOSIS — E785 Hyperlipidemia, unspecified: Secondary | ICD-10-CM

## 2012-05-07 DIAGNOSIS — Z0000 Encounter for general adult medical examination without abnormal findings: Secondary | ICD-10-CM | POA: Diagnosis not present

## 2012-05-07 DIAGNOSIS — M949 Disorder of cartilage, unspecified: Secondary | ICD-10-CM | POA: Diagnosis not present

## 2012-05-07 DIAGNOSIS — M899 Disorder of bone, unspecified: Secondary | ICD-10-CM

## 2012-05-07 NOTE — Progress Notes (Signed)
71 yo here for annual wellness visit.   HTN- h/o white coat sydrome.  Bring in home daily log, BPs randing 115/70- 137/78.  Currently taking amlodipine 5 mg daily.  Does get occassional swelling in her feet from the amlodipine.  no HA, blurred vision, CP, SOB.  HLD- chronic low HDL, exercises 6 days per week.  Takes Simvastatin 40 mg and Fish oil 1000 mg daily.  Lab Results  Component Value Date   CHOL 165 04/30/2012   HDL 34.00* 04/30/2012   LDLCALC 109* 04/30/2012   TRIG 111.0 04/30/2012   CHOLHDL 5 04/30/2012    Osteopenia- last bone density 03/2010 was normal!     No fractures, had been on Fosamax for almost 5 years so we d/c'd it last year.  H/o breast CA- 1998- s/p right lumpectomy and radiation.  No recurrence.  UTD on mammogram.  I have personally reviewed the Medicare Annual Wellness questionnaire and have noted 1. The patient's medical and social history 2. Their use of alcohol, tobacco or illicit drugs 3. Their current medications and supplements 4. The patient's functional ability including ADL's, fall risks, home safety risks and hearing or visual             impairment. 5. Diet and physical activities 6. Evidence for depression or mood disorders  Patient Active Problem List  Diagnosis  . VITAMIN D DEFICIENCY  . PURE HYPERCHOLESTEROLEMIA  . HYPERLIPIDEMIA  . HYPERTENSION  . SEBORRHEIC KERATOSIS  . OSTEOPENIA  . BREAST CANCER, HX OF  . COLONIC POLYPS, HX OF  . Routine general medical examination at a health care facility  . Bronchitis  . Candidal skin infection   Past Medical History  Diagnosis Date  . Cancer     breast  . Hyperlipidemia   . Hypertension   . Colonic polyp    Past Surgical History  Procedure Date  . Breast lumpectomy    History  Substance Use Topics  . Smoking status: Never Smoker   . Smokeless tobacco: Not on file  . Alcohol Use: No   Family History  Problem Relation Age of Onset  . Cancer Mother   . Cancer Father    Allergies    Allergen Reactions  . Codeine     REACTION: Nigthmares   Current Outpatient Prescriptions on File Prior to Visit  Medication Sig Dispense Refill  . amLODipine (NORVASC) 5 MG tablet TAKE 1 TABLET (5 MG TOTAL) BY MOUTH DAILY.  90 tablet  0  . aspirin 81 MG tablet Take 81 mg by mouth every other day.       . Calcium 1200-1000 MG-UNIT CHEW Chew 1 capsule by mouth daily.  90 each  3  . chlorpheniramine-HYDROcodone (TUSSIONEX) 10-8 MG/5ML LQCR Take 5 mLs by mouth at bedtime as needed. Sedation precautions  140 mL  0  . Cholecalciferol (VITAMIN D3) 1000 UNITS CAPS Take 1 capsule by mouth daily.  90 capsule  3  . diphenhydrAMINE (SOMINEX) 25 MG tablet Take 1 tablet (25 mg total) by mouth at bedtime as needed.  90 tablet  3  . fluticasone (FLONASE) 50 MCG/ACT nasal spray Place 2 sprays into the nose daily.  16 g  11  . loratadine (CLARITIN) 10 MG tablet Take 1 tablet (10 mg total) by mouth daily.  90 tablet  3  . Multiple Vitamin (MULTIVITAMIN) capsule Take 1 capsule by mouth daily.        . Multiple Vitamins-Minerals (OCUVITE PRESERVISION) TABS 1 tablet 2 (two) times daily.  Use as directed      . nystatin-triamcinolone ointment (MYCOLOG) Apply topically 2 (two) times daily.  30 g  0  . Omega-3 Fatty Acids (FISH OIL) 1000 MG CAPS Take 1 capsule (1,000 mg total) by mouth daily.  90 capsule  3  . simvastatin (ZOCOR) 40 MG tablet Take 1 tablet (40 mg total) by mouth at bedtime.  90 tablet  3   The PMH, PSH, Social History, Family History, Medications, and allergies have been reviewed in [facility], and have been updated if relevant.  Review of Systems       See HPI Patient reports no  vision/ hearing changes,anorexia, weight change, fever ,adenopathy, persistant / recurrent hoarseness, swallowing issues, chest pain, edema,persistant / recurrent cough, hemoptysis, dyspnea(rest, exertional, paroxysmal nocturnal), gastrointestinal  bleeding (melena, rectal bleeding), abdominal pain, excessive heart burn, GU  symptoms(dysuria, hematuria, pyuria, voiding/incontinence  Issues) syncope, focal weakness, severe memory loss, concerning skin lesions, depression, anxiety, abnormal bruising/bleeding, major joint swelling, breast masses or abnormal vaginal bleeding.     Physical Exam BP 144/90  Pulse 72  Temp 98.1 F (36.7 C)  Ht 5\' 3"  (1.6 m)  Wt 153 lb (69.4 kg)  BMI 27.10 kg/m2  General:  Well-developed,well-nourished,in no acute distress; alert,appropriate and cooperative throughout examination Head:  normocephalic, atraumatic, and no abnormalities observed.   Eyes:  vision grossly intact, pupils equal, and pupils round.   Mouth:  good dentition.   Lungs:  Normal respiratory effort, chest expands symmetrically. Lungs are clear to auscultation, no crackles or wheezes. Heart:  Normal rate and regular rhythm. S1 and S2 normal without gallop, murmur, click, rub or other extra sounds. Abdomen:  Bowel sounds positive,abdomen soft and non-tender without masses, organomegaly or hernias noted. Extremities:  no edema Neurologic:  alert & oriented X3.   Skin:  seborrheic keratosis.   Psych:  memory intact for recent and remote, normally interactive, good eye contact, and not anxious appearing.    Assessment and Plan: 1. Routine general medical examination at a health care facility  The patients weight, height, BMI and visual acuity have been recorded in the chart I have made referrals, counseling and provided education to the patient based review of the above and I have provided the pt with a written personalized care plan for preventive services.     2. Pure hypercholesterolemia  Stable.  Continue current dose of Simvastatin, continue weight bearing exercises.   3. OSTEOPENIA   Orders Placed This Encounter  Procedures  . DG Bone Density      4. HYPERTENSION         Stable.  Known h/o white coat HTN.  Continue current dose of Amlodipine.

## 2012-05-07 NOTE — Patient Instructions (Signed)
Great to see you. Please stop by to see Heidi Sanchez on your way out to set up your bone density scan.

## 2012-06-14 DIAGNOSIS — Z23 Encounter for immunization: Secondary | ICD-10-CM | POA: Diagnosis not present

## 2012-06-25 ENCOUNTER — Ambulatory Visit
Admission: RE | Admit: 2012-06-25 | Discharge: 2012-06-25 | Disposition: A | Discharge status: Home / Self Care | Payer: Medicare Other | Source: Ambulatory Visit | Attending: Family Medicine | Admitting: Family Medicine

## 2012-06-25 DIAGNOSIS — Z780 Asymptomatic menopausal state: Secondary | ICD-10-CM | POA: Diagnosis not present

## 2012-06-25 DIAGNOSIS — M899 Disorder of bone, unspecified: Secondary | ICD-10-CM

## 2012-06-25 DIAGNOSIS — M949 Disorder of cartilage, unspecified: Secondary | ICD-10-CM

## 2012-06-27 ENCOUNTER — Encounter: Payer: Self-pay | Admitting: Family Medicine

## 2012-06-27 ENCOUNTER — Encounter: Payer: Self-pay | Admitting: *Deleted

## 2012-07-02 ENCOUNTER — Telehealth: Payer: Self-pay | Admitting: *Deleted

## 2012-07-02 NOTE — Telephone Encounter (Signed)
Advised pt's husband that pt's bone density test was normal.

## 2012-07-16 ENCOUNTER — Other Ambulatory Visit: Payer: Self-pay | Admitting: *Deleted

## 2012-07-16 MED ORDER — SIMVASTATIN 40 MG PO TABS: 40.0000 mg | ORAL_TABLET | Freq: Every day | ORAL | Status: DC

## 2012-07-22 ENCOUNTER — Other Ambulatory Visit: Payer: Self-pay | Admitting: Family Medicine

## 2012-10-11 ENCOUNTER — Other Ambulatory Visit: Payer: Self-pay

## 2012-10-11 MED ORDER — FLUTICASONE PROPIONATE 50 MCG/ACT NA SUSP: 2.0000 | Freq: Every day | NASAL | Status: DC

## 2012-10-11 MED ORDER — SIMVASTATIN 40 MG PO TABS: 40.0000 mg | ORAL_TABLET | Freq: Every day | ORAL | Status: DC

## 2012-10-11 MED ORDER — AMLODIPINE BESYLATE 5 MG PO TABS: 5.0000 mg | ORAL_TABLET | Freq: Every day | ORAL | Status: DC

## 2012-10-11 NOTE — Telephone Encounter (Signed)
Pt left note changing pharmacy and needs 90 day rx for simvastatin 40 mg, amlodipine 5 mg and fluticasone during allergy season. Pt request call when ready for pick up.

## 2012-10-12 MED ORDER — AMLODIPINE BESYLATE 5 MG PO TABS: ORAL_TABLET | ORAL | Status: DC

## 2012-10-12 MED ORDER — SIMVASTATIN 40 MG PO TABS: 40.0000 mg | ORAL_TABLET | Freq: Every day | ORAL | Status: DC

## 2012-10-12 MED ORDER — FLUTICASONE PROPIONATE 50 MCG/ACT NA SUSP: 2.0000 | Freq: Every day | NASAL | Status: DC

## 2012-10-12 NOTE — Addendum Note (Signed)
Addended by: Eliezer Bottom on: 10/12/2012 10:00 AM   Modules accepted: Orders

## 2012-10-12 NOTE — Telephone Encounter (Signed)
Scripts printed, placed on doctor's desk for signature. 

## 2012-10-12 NOTE — Telephone Encounter (Signed)
Advised patient's husband scripts are ready for pick up.

## 2013-02-01 DIAGNOSIS — L570 Actinic keratosis: Secondary | ICD-10-CM | POA: Diagnosis not present

## 2013-02-01 DIAGNOSIS — L821 Other seborrheic keratosis: Secondary | ICD-10-CM | POA: Diagnosis not present

## 2013-02-11 DIAGNOSIS — H353290 Exudative age-related macular degeneration, unspecified eye, stage unspecified: Secondary | ICD-10-CM | POA: Diagnosis not present

## 2013-02-19 ENCOUNTER — Other Ambulatory Visit: Payer: Self-pay

## 2013-02-19 DIAGNOSIS — Z1231 Encounter for screening mammogram for malignant neoplasm of breast: Secondary | ICD-10-CM

## 2013-04-02 ENCOUNTER — Ambulatory Visit
Admission: RE | Admit: 2013-04-02 | Discharge: 2013-04-02 | Disposition: A | Discharge status: Home / Self Care | Payer: Medicare Other | Source: Ambulatory Visit

## 2013-04-02 DIAGNOSIS — Z1231 Encounter for screening mammogram for malignant neoplasm of breast: Secondary | ICD-10-CM

## 2013-04-02 DIAGNOSIS — Z853 Personal history of malignant neoplasm of breast: Secondary | ICD-10-CM | POA: Diagnosis not present

## 2013-05-13 ENCOUNTER — Other Ambulatory Visit (INDEPENDENT_AMBULATORY_CARE_PROVIDER_SITE_OTHER): Payer: Medicare Other

## 2013-05-13 DIAGNOSIS — E559 Vitamin D deficiency, unspecified: Secondary | ICD-10-CM

## 2013-05-13 DIAGNOSIS — Z79899 Other long term (current) drug therapy: Secondary | ICD-10-CM

## 2013-05-13 DIAGNOSIS — I10 Essential (primary) hypertension: Secondary | ICD-10-CM

## 2013-05-13 DIAGNOSIS — Z0000 Encounter for general adult medical examination without abnormal findings: Secondary | ICD-10-CM

## 2013-05-13 DIAGNOSIS — E7800 Pure hypercholesterolemia, unspecified: Secondary | ICD-10-CM | POA: Diagnosis not present

## 2013-05-13 LAB — LIPID PANEL
Cholesterol: 162 mg/dL (ref 0–200)
LDL Cholesterol: 108 mg/dL — ABNORMAL HIGH (ref 0–99)
Total CHOL/HDL Ratio: 5
VLDL: 21 mg/dL (ref 0.0–40.0)

## 2013-05-13 LAB — CBC WITH DIFFERENTIAL/PLATELET
Basophils Relative: 0.8 % (ref 0.0–3.0)
Eosinophils Absolute: 0.1 10*3/uL (ref 0.0–0.7)
MCHC: 33.7 g/dL (ref 30.0–36.0)
MCV: 96.9 fl (ref 78.0–100.0)
Monocytes Absolute: 0.7 10*3/uL (ref 0.1–1.0)
Neutro Abs: 3 10*3/uL (ref 1.4–7.7)
Neutrophils Relative %: 50.8 % (ref 43.0–77.0)
RBC: 4.36 Mil/uL (ref 3.87–5.11)

## 2013-05-13 LAB — COMPREHENSIVE METABOLIC PANEL
ALT: 27 U/L (ref 0–35)
AST: 27 U/L (ref 0–37)
Albumin: 4.1 g/dL (ref 3.5–5.2)
Alkaline Phosphatase: 47 U/L (ref 39–117)
BUN: 16 mg/dL (ref 6–23)
Potassium: 4.5 mEq/L (ref 3.5–5.1)
Sodium: 141 mEq/L (ref 135–145)

## 2013-05-14 LAB — VITAMIN D 25 HYDROXY (VIT D DEFICIENCY, FRACTURES): Vit D, 25-Hydroxy: 39 ng/mL (ref 30–89)

## 2013-05-16 ENCOUNTER — Ambulatory Visit (INDEPENDENT_AMBULATORY_CARE_PROVIDER_SITE_OTHER): Payer: Medicare Other | Admitting: Family Medicine

## 2013-05-16 ENCOUNTER — Encounter: Payer: Self-pay | Admitting: Family Medicine

## 2013-05-16 VITALS — BP 150/90 | HR 72 | Temp 98.1°F | Ht 63.0 in | Wt 155.0 lb

## 2013-05-16 DIAGNOSIS — E559 Vitamin D deficiency, unspecified: Secondary | ICD-10-CM | POA: Diagnosis not present

## 2013-05-16 DIAGNOSIS — I10 Essential (primary) hypertension: Secondary | ICD-10-CM | POA: Diagnosis not present

## 2013-05-16 DIAGNOSIS — M899 Disorder of bone, unspecified: Secondary | ICD-10-CM

## 2013-05-16 DIAGNOSIS — Z0000 Encounter for general adult medical examination without abnormal findings: Secondary | ICD-10-CM

## 2013-05-16 DIAGNOSIS — E7800 Pure hypercholesterolemia, unspecified: Secondary | ICD-10-CM | POA: Diagnosis not present

## 2013-05-16 DIAGNOSIS — M949 Disorder of cartilage, unspecified: Secondary | ICD-10-CM

## 2013-05-16 NOTE — Progress Notes (Signed)
72 yo here for annual wellness visit.   HTN- h/o white coat sydrome.  Bring in home daily log, BPs randing 115/70- 137/78.  Currently taking amlodipine 5 mg daily.  Does get occassional swelling in her feet from the amlodipine.  no HA, blurred vision, CP, SOB.  She would like to try to come off of amlodipine if possible.  HLD- chronic low HDL, exercises 6 days per week.  Takes Simvastatin 40 mg and Fish oil 1000 mg daily.  Lab Results  Component Value Date   CHOL 162 05/13/2013   HDL 32.90* 05/13/2013   LDLCALC 108* 05/13/2013   TRIG 105.0 05/13/2013   CHOLHDL 5 05/13/2013    Osteopenia- last bone density 06/2012 was normal.   No fractures, had been on Fosamax for almost 5 years so we d/c'd it two years ago.  She is taking calcium vitamin D and exercises at the gym 5 days per week.  H/o breast CA- 1998- s/p right lumpectomy and radiation.  No recurrence.  UTD on mammogram (04/02/2013).  I have personally reviewed the Medicare Annual Wellness questionnaire and have noted 1. The patient's medical and social history 2. Their use of alcohol, tobacco or illicit drugs 3. Their current medications and supplements 4. The patient's functional ability including ADL's, fall risks, home safety risks and hearing or visual             impairment. 5. Diet and physical activities 6. Evidence for depression or mood disorders  End of life wishes discussed and updated in Social History.  Patient Active Problem List   Diagnosis Date Noted  . Routine general medical examination at a health care facility 04/26/2011  . HYPERLIPIDEMIA 02/15/2010  . 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 OSTEOPENIA 02/15/2010  . BREAST CANCER, HX OF 02/15/2010  . COLONIC POLYPS, HX OF 02/15/2010  . VITAMIN D DEFICIENCY 02/09/2010  . PURE HYPERCHOLESTEROLEMIA 02/09/2010  . HYPERTENSION 02/09/2010   Past Medical History  Diagnosis Date  . Cancer     breast  . Hyperlipidemia   . Hypertension   . Colonic polyp    Past  Surgical History  Procedure Laterality Date  . Breast lumpectomy     History  Substance Use Topics  . Smoking status: Never Smoker   . Smokeless tobacco: Not on file  . Alcohol Use: No   Family History  Problem Relation Age of Onset  . Cancer Mother   . Cancer Father    Allergies  Allergen Reactions  . Codeine     REACTION: Nigthmares   Current Outpatient Prescriptions on File Prior to Visit  Medication Sig Dispense Refill  . amLODipine (NORVASC) 5 MG tablet Take one by mouth daily  90 tablet  3  . aspirin 81 MG tablet Take 81 mg by mouth every other day.       . Calcium 1200-1000 MG-UNIT CHEW Chew 1 capsule by mouth daily.  90 each  3  . Cholecalciferol (VITAMIN D3) 1000 UNITS CAPS Take 1 capsule by mouth daily.  90 capsule  3  . diphenhydrAMINE (SOMINEX) 25 MG tablet Take 1 tablet (25 mg total) by mouth at bedtime as needed.  90 tablet  3  . fluticasone (FLONASE) 50 MCG/ACT nasal spray Place 2 sprays into the nose daily.  48 g  3  . Multiple Vitamins-Minerals (OCUVITE PRESERVISION) TABS 1 tablet 2 (two) times daily. Use as directed      . Omega-3 Fatty Acids (FISH OIL) 1000 MG CAPS Take 1 capsule (1,000  mg total) by mouth daily.  90 capsule  3  . simvastatin (ZOCOR) 40 MG tablet Take 1 tablet (40 mg total) by mouth at bedtime.  90 tablet  3   No current facility-administered medications on file prior to visit.   The PMH, PSH, Social History, Family History, Medications, and allergies have been reviewed in [facility], and have been updated if relevant.  Review of Systems       See HPI Patient reports no  vision/ hearing changes,anorexia, weight change, fever ,adenopathy, persistant / recurrent hoarseness, swallowing issues, chest pain, edema,persistant / recurrent cough, hemoptysis, dyspnea(rest, exertional, paroxysmal nocturnal), gastrointestinal  bleeding (melena, rectal bleeding), abdominal pain, excessive heart burn, GU symptoms(dysuria, hematuria, pyuria, voiding/incontinence   Issues) syncope, focal weakness, severe memory loss, concerning skin lesions, depression, anxiety, abnormal bruising/bleeding, major joint swelling, breast masses or abnormal vaginal bleeding.     Physical Exam BP 150/90  Pulse 72  Temp(Src) 98.1 F (36.7 C)  Ht 5\' 3"  (1.6 m)  Wt 155 lb (70.308 kg)  BMI 27.46 kg/m2  General:  Well-developed,well-nourished,in no acute distress; alert,appropriate and cooperative throughout examination Head:  normocephalic, atraumatic, and no abnormalities observed.   Eyes:  vision grossly intact, pupils equal, and pupils round.   Mouth:  good dentition.   Lungs:  Normal respiratory effort, chest expands symmetrically. Lungs are clear to auscultation, no crackles or wheezes. Heart:  Normal rate and regular rhythm. S1 and S2 normal without gallop, murmur, click, rub or other extra sounds. Abdomen:  Bowel sounds positive,abdomen soft and non-tender without masses, organomegaly or hernias noted. Extremities:  no edema Neurologic:  alert & oriented X3.   Skin:  seborrheic keratosis.   Psych:  memory intact for recent and remote, normally interactive, good eye contact, and not anxious appearing.    Assessment and Plan:  1. Routine general medical examination at a health care facility The patients weight, height, BMI and visual acuity have been recorded in the chart I have made referrals, counseling and provided education to the patient based review of the above and I have provided the pt with a written personalized care plan for preventive services.   2. VITAMIN D DEFICIENCY Vit D wnl. No changes.  3. Pure hypercholesterolemia Good control on current dose of Zocor.  No changes.  4. HYPERTENSION Well controlled at home, does have white coat HTN. On low dose amlodipine- will try to d/c it.  She is very reliable and will call me in 2 weeks with BP readings.  5. OSTEOPENIA Last bone density normal.

## 2013-05-16 NOTE — Patient Instructions (Addendum)
Good to see you. It's ok to try to stop taking your amlodipine. Keep checking your blood pressure for me- call me with an update in 2 weeks.  Please call your gastroenterologist to set up your colonoscopy.

## 2013-06-21 DIAGNOSIS — Z23 Encounter for immunization: Secondary | ICD-10-CM | POA: Diagnosis not present

## 2013-07-01 ENCOUNTER — Other Ambulatory Visit: Payer: Self-pay | Admitting: Family Medicine

## 2013-07-01 NOTE — Telephone Encounter (Signed)
See Drug-Drug Warning.  Ok to refill? 

## 2013-07-21 ENCOUNTER — Other Ambulatory Visit: Payer: Self-pay | Admitting: Family Medicine

## 2013-08-12 DIAGNOSIS — H353190 Nonexudative age-related macular degeneration, unspecified eye, stage unspecified: Secondary | ICD-10-CM | POA: Diagnosis not present

## 2013-09-30 DIAGNOSIS — H40009 Preglaucoma, unspecified, unspecified eye: Secondary | ICD-10-CM | POA: Diagnosis not present

## 2013-10-25 ENCOUNTER — Other Ambulatory Visit: Payer: Self-pay | Admitting: Gastroenterology

## 2013-10-25 DIAGNOSIS — Z09 Encounter for follow-up examination after completed treatment for conditions other than malignant neoplasm: Secondary | ICD-10-CM | POA: Diagnosis not present

## 2013-10-25 DIAGNOSIS — Z86010 Personal history of colonic polyps: Secondary | ICD-10-CM | POA: Diagnosis not present

## 2013-10-25 DIAGNOSIS — Z800 Family history of malignant neoplasm of digestive organs: Secondary | ICD-10-CM | POA: Diagnosis not present

## 2013-10-25 DIAGNOSIS — K5730 Diverticulosis of large intestine without perforation or abscess without bleeding: Secondary | ICD-10-CM | POA: Diagnosis not present

## 2013-10-25 DIAGNOSIS — D126 Benign neoplasm of colon, unspecified: Secondary | ICD-10-CM | POA: Diagnosis not present

## 2013-10-29 LAB — HM COLONOSCOPY

## 2013-11-01 ENCOUNTER — Encounter: Payer: Self-pay | Admitting: Family Medicine

## 2014-01-06 ENCOUNTER — Other Ambulatory Visit: Payer: Self-pay | Admitting: Family Medicine

## 2014-02-14 DIAGNOSIS — H4050X0 Glaucoma secondary to other eye disorders, unspecified eye, stage unspecified: Secondary | ICD-10-CM | POA: Diagnosis not present

## 2014-03-07 ENCOUNTER — Other Ambulatory Visit: Payer: Self-pay

## 2014-03-07 DIAGNOSIS — Z1231 Encounter for screening mammogram for malignant neoplasm of breast: Secondary | ICD-10-CM

## 2014-03-07 DIAGNOSIS — Z853 Personal history of malignant neoplasm of breast: Secondary | ICD-10-CM

## 2014-03-07 DIAGNOSIS — Z9889 Other specified postprocedural states: Secondary | ICD-10-CM

## 2014-04-03 ENCOUNTER — Ambulatory Visit
Admission: RE | Admit: 2014-04-03 | Discharge: 2014-04-03 | Disposition: A | Discharge status: Home / Self Care | Payer: Medicare Other | Source: Ambulatory Visit

## 2014-04-03 DIAGNOSIS — Z1231 Encounter for screening mammogram for malignant neoplasm of breast: Secondary | ICD-10-CM

## 2014-04-03 DIAGNOSIS — Z9889 Other specified postprocedural states: Secondary | ICD-10-CM

## 2014-04-03 DIAGNOSIS — Z853 Personal history of malignant neoplasm of breast: Secondary | ICD-10-CM

## 2014-04-09 ENCOUNTER — Ambulatory Visit: Payer: Medicare Other | Admitting: Family Medicine

## 2014-04-21 DIAGNOSIS — H40009 Preglaucoma, unspecified, unspecified eye: Secondary | ICD-10-CM | POA: Diagnosis not present

## 2014-05-19 ENCOUNTER — Other Ambulatory Visit: Payer: Self-pay | Admitting: Family Medicine

## 2014-05-19 DIAGNOSIS — E559 Vitamin D deficiency, unspecified: Secondary | ICD-10-CM

## 2014-05-19 DIAGNOSIS — I10 Essential (primary) hypertension: Secondary | ICD-10-CM

## 2014-05-19 DIAGNOSIS — E785 Hyperlipidemia, unspecified: Secondary | ICD-10-CM

## 2014-05-19 DIAGNOSIS — Z0000 Encounter for general adult medical examination without abnormal findings: Secondary | ICD-10-CM

## 2014-05-22 ENCOUNTER — Other Ambulatory Visit (INDEPENDENT_AMBULATORY_CARE_PROVIDER_SITE_OTHER): Payer: Medicare Other

## 2014-05-22 DIAGNOSIS — E7800 Pure hypercholesterolemia, unspecified: Secondary | ICD-10-CM

## 2014-05-22 DIAGNOSIS — E559 Vitamin D deficiency, unspecified: Secondary | ICD-10-CM | POA: Diagnosis not present

## 2014-05-22 DIAGNOSIS — M899 Disorder of bone, unspecified: Secondary | ICD-10-CM

## 2014-05-22 DIAGNOSIS — E785 Hyperlipidemia, unspecified: Secondary | ICD-10-CM | POA: Diagnosis not present

## 2014-05-22 DIAGNOSIS — M949 Disorder of cartilage, unspecified: Secondary | ICD-10-CM

## 2014-05-22 DIAGNOSIS — Z0000 Encounter for general adult medical examination without abnormal findings: Secondary | ICD-10-CM | POA: Diagnosis not present

## 2014-05-22 DIAGNOSIS — I10 Essential (primary) hypertension: Secondary | ICD-10-CM

## 2014-05-22 DIAGNOSIS — Z79899 Other long term (current) drug therapy: Secondary | ICD-10-CM

## 2014-05-22 LAB — COMPREHENSIVE METABOLIC PANEL
ALBUMIN: 4 g/dL (ref 3.5–5.2)
ALK PHOS: 49 U/L (ref 39–117)
ALT: 24 U/L (ref 0–35)
AST: 25 U/L (ref 0–37)
BUN: 13 mg/dL (ref 6–23)
CALCIUM: 10.2 mg/dL (ref 8.4–10.5)
CHLORIDE: 105 meq/L (ref 96–112)
CO2: 30 meq/L (ref 19–32)
Creatinine, Ser: 0.9 mg/dL (ref 0.4–1.2)
GFR: 65.61 mL/min (ref >60.00)
GLUCOSE: 98 mg/dL (ref 70–99)
POTASSIUM: 4 meq/L (ref 3.5–5.1)
SODIUM: 140 meq/L (ref 135–145)
TOTAL PROTEIN: 7 g/dL (ref 6.0–8.3)
Total Bilirubin: 1 mg/dL (ref 0.2–1.2)

## 2014-05-22 LAB — CBC WITH DIFFERENTIAL/PLATELET
BASOS PCT: 0.7 % (ref 0.0–3.0)
Basophils Absolute: 0 10*3/uL (ref 0.0–0.1)
EOS PCT: 1.1 % (ref 0.0–5.0)
Eosinophils Absolute: 0.1 10*3/uL (ref 0.0–0.7)
HCT: 41.2 % (ref 36.0–46.0)
Hemoglobin: 14 g/dL (ref 12.0–15.0)
LYMPHS PCT: 37.9 % (ref 12.0–46.0)
Lymphs Abs: 2 10*3/uL (ref 0.7–4.0)
MCHC: 34 g/dL (ref 30.0–36.0)
MCV: 95.9 fl (ref 78.0–100.0)
MONO ABS: 0.7 10*3/uL (ref 0.1–1.0)
MONOS PCT: 12.7 % — AB (ref 3.0–12.0)
NEUTROS PCT: 47.6 % (ref 43.0–77.0)
Neutro Abs: 2.6 10*3/uL (ref 1.4–7.7)
PLATELETS: 233 10*3/uL (ref 150.0–400.0)
RBC: 4.29 Mil/uL (ref 3.87–5.11)
RDW: 12.7 % (ref 11.5–15.5)
WBC: 5.4 10*3/uL (ref 4.0–10.5)

## 2014-05-22 LAB — LIPID PANEL
CHOLESTEROL: 169 mg/dL (ref 0–200)
HDL: 28 mg/dL — ABNORMAL LOW (ref >39.00)
LDL CALC: 115 mg/dL — AB (ref 0–99)
NonHDL: 141
TRIGLYCERIDES: 130 mg/dL (ref 0.0–149.0)
Total CHOL/HDL Ratio: 6
VLDL: 26 mg/dL (ref 0.0–40.0)

## 2014-05-22 LAB — TSH: TSH: 1.38 u[IU]/mL (ref 0.35–4.50)

## 2014-05-22 LAB — VITAMIN D 25 HYDROXY (VIT D DEFICIENCY, FRACTURES): VITD: 32.17 ng/mL (ref 30.00–100.00)

## 2014-05-26 ENCOUNTER — Encounter: Payer: Self-pay | Admitting: Family Medicine

## 2014-05-26 ENCOUNTER — Ambulatory Visit (INDEPENDENT_AMBULATORY_CARE_PROVIDER_SITE_OTHER): Payer: Medicare Other | Admitting: Family Medicine

## 2014-05-26 VITALS — BP 144/86 | HR 75 | Temp 97.8°F | Ht 62.75 in | Wt 155.2 lb

## 2014-05-26 DIAGNOSIS — Z0000 Encounter for general adult medical examination without abnormal findings: Secondary | ICD-10-CM | POA: Insufficient documentation

## 2014-05-26 DIAGNOSIS — E785 Hyperlipidemia, unspecified: Secondary | ICD-10-CM

## 2014-05-26 DIAGNOSIS — M899 Disorder of bone, unspecified: Secondary | ICD-10-CM

## 2014-05-26 DIAGNOSIS — Z23 Encounter for immunization: Secondary | ICD-10-CM | POA: Diagnosis not present

## 2014-05-26 DIAGNOSIS — Z853 Personal history of malignant neoplasm of breast: Secondary | ICD-10-CM

## 2014-05-26 DIAGNOSIS — M949 Disorder of cartilage, unspecified: Secondary | ICD-10-CM

## 2014-05-26 DIAGNOSIS — Z86010 Personal history of colonic polyps: Secondary | ICD-10-CM

## 2014-05-26 DIAGNOSIS — I10 Essential (primary) hypertension: Secondary | ICD-10-CM

## 2014-05-26 NOTE — Assessment & Plan Note (Signed)
Well controlled. No changes today. 

## 2014-05-26 NOTE — Progress Notes (Signed)
73 yo pleasant female here for annual wellness visit.  I have personally reviewed the Medicare Annual Wellness questionnaire and have noted 1. The patient's medical and social history 2. Their use of alcohol, tobacco or illicit drugs 3. Their current medications and supplements 4. The patient's functional ability including ADL's, fall risks, home safety risks and hearing or visual             impairment. 5. Diet and physical activities 6. Evidence for depression or mood disorders  End of life wishes discussed and updated in Social History.  The roster of all physicians providing medical care to patient - is listed in the Snapshot section of the chart.  Colonoscopy 10/25/13 ([place])- 5 year recall Mammogram 04/08/14 Td 02/11/09 Zoster 02/15/10 DEXA- 06/27/12- normal Eye exam June 15th  LMP over 25 years ago. No post menopausl bleeding.   HTN- h/o white coat sydrome.  Bring in home daily log, BPs randing 118/70- 135/78.  Currently taking amlodipine 5 mg daily.    no HA, blurred vision, CP, SOB.   Did try to stop taking it and had to restart it due to elevated BP readings.  HLD- chronic low HDL, exercises 6 days per week.  Takes Simvastatin 40 mg and Fish oil 1000 mg daily.  Lab Results  Component Value Date   CHOL 169 05/22/2014   HDL 28.00* 05/22/2014   LDLCALC 115* 05/22/2014   TRIG 130.0 05/22/2014   CHOLHDL 6 05/22/2014    Osteopenia- last bone density 06/2012 was normal.   No fractures, had been on Fosamax for almost 5 years so we d/c'd it two years ago.  She is taking calcium vitamin D and exercises at the gym 5 days per week.  H/o breast CA- 1998- s/p right lumpectomy and radiation.  No recurrence.  UTD on mammogram.    Patient Active Problem List   Diagnosis Date Noted  . Medicare annual wellness visit, subsequent 05/26/2014  . HYPERLIPIDEMIA 02/15/2010  . 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 OSTEOPENIA 02/15/2010  . BREAST CANCER, HX OF 02/15/2010  . COLONIC POLYPS, HX OF  02/15/2010  . VITAMIN D DEFICIENCY 02/09/2010  . PURE HYPERCHOLESTEROLEMIA 02/09/2010  . HYPERTENSION 02/09/2010   Past Medical History  Diagnosis Date  . Cancer     breast  . Hyperlipidemia   . Hypertension   . Colonic polyp    Past Surgical History  Procedure Laterality Date  . Breast lumpectomy     History  Substance Use Topics  . Smoking status: Never Smoker   . Smokeless tobacco: Not on file  . Alcohol Use: No   Family History  Problem Relation Age of Onset  . Cancer Mother   . Cancer Father    Allergies  Allergen Reactions  . Codeine     REACTION: Nigthmares   Current Outpatient Prescriptions on File Prior to Visit  Medication Sig Dispense Refill  . amLODipine (NORVASC) 5 MG tablet TAKE 1 TABLET (5 MG TOTAL) BY MOUTH DAILY.  90 tablet  1  . aspirin 81 MG tablet Take 81 mg by mouth every other day.       . Calcium 1200-1000 MG-UNIT CHEW Chew 1 capsule by mouth daily.  90 each  3  . Cholecalciferol (VITAMIN D3) 1000 UNITS CAPS Take 1 capsule by mouth daily.  90 capsule  3  . diphenhydrAMINE (SOMINEX) 25 MG tablet Take 1 tablet (25 mg total) by mouth at bedtime as needed.  90 tablet  3  . Multiple Vitamins-Minerals (  OCUVITE PRESERVISION) TABS 1 tablet 2 (two) times daily. Use as directed      . Omega-3 Fatty Acids (FISH OIL) 1000 MG CAPS Take 1 capsule (1,000 mg total) by mouth daily.  90 capsule  3  . simvastatin (ZOCOR) 40 MG tablet TAKE 1 TABLET (40 MG TOTAL) BY MOUTH AT BEDTIME.  90 tablet  1   No current facility-administered medications on file prior to visit.   The PMH, PSH, Social History, Family History, Medications, and allergies have been reviewed in [facility], and have been updated if relevant.  Review of Systems       See HPI Patient reports no  vision/ hearing changes,anorexia, weight change, fever ,adenopathy, persistant / recurrent hoarseness, swallowing issues, chest pain, edema,persistant / recurrent cough, hemoptysis, dyspnea(rest, exertional,  paroxysmal nocturnal), gastrointestinal  bleeding (melena, rectal bleeding), abdominal pain, excessive heart burn, GU symptoms(dysuria, hematuria, pyuria, voiding/incontinence  Issues) syncope, focal weakness, severe memory loss, concerning skin lesions, depression, anxiety, abnormal bruising/bleeding, major joint swelling, breast masses or abnormal vaginal bleeding.     Physical Exam BP 144/86  Pulse 75  Temp(Src) 97.8 F (36.6 C) (Oral)  Ht 5' 2.75" (1.594 m)  Wt 155 lb 4 oz (70.421 kg)  BMI 27.72 kg/m2  SpO2 95%  General:  Well-developed,well-nourished,in no acute distress; alert,appropriate and cooperative throughout examination Head:  normocephalic, atraumatic, and no abnormalities observed.   Eyes:  vision grossly intact, pupils equal, and pupils round.   Mouth:  good dentition.   Lungs:  Normal respiratory effort, chest expands symmetrically. Lungs are clear to auscultation, no crackles or wheezes. Heart:  Normal rate and regular rhythm. S1 and S2 normal without gallop, murmur, click, rub or other extra sounds. Abdomen:  Bowel sounds positive,abdomen soft and non-tender without masses, organomegaly or hernias noted. Extremities:  +thick ankles, non pitting Neurologic:  alert & oriented X3.   Skin:  seborrheic keratosis.   Psych:  memory intact for recent and remote, normally interactive, good eye contact, and not anxious appearing.

## 2014-05-26 NOTE — Assessment & Plan Note (Signed)
The patients weight, height, BMI and visual acuity have been recorded in the chart I have made referrals, counseling and provided education to the patient based review of the above and I have provided the pt with a written personalized care plan for preventive services.  Prevnar 13  Flu shot declined.

## 2014-05-26 NOTE — Assessment & Plan Note (Signed)
Stable. Chronically low HDL.

## 2014-05-26 NOTE — Progress Notes (Signed)
Pre visit review using our clinic review tool, if applicable. No additional management support is needed unless otherwise documented below in the visit note. 

## 2014-06-30 ENCOUNTER — Other Ambulatory Visit: Payer: Self-pay | Admitting: *Deleted

## 2014-06-30 MED ORDER — AMLODIPINE BESYLATE 5 MG PO TABS: ORAL_TABLET | ORAL | Status: DC

## 2014-07-08 ENCOUNTER — Other Ambulatory Visit: Payer: Self-pay | Admitting: *Deleted

## 2014-07-08 DIAGNOSIS — Z23 Encounter for immunization: Secondary | ICD-10-CM | POA: Diagnosis not present

## 2014-07-08 MED ORDER — SIMVASTATIN 40 MG PO TABS: ORAL_TABLET | ORAL | Status: DC

## 2014-09-10 DIAGNOSIS — H3531 Nonexudative age-related macular degeneration: Secondary | ICD-10-CM | POA: Diagnosis not present

## 2014-10-01 ENCOUNTER — Other Ambulatory Visit: Payer: Self-pay | Admitting: *Deleted

## 2014-10-01 MED ORDER — SIMVASTATIN 40 MG PO TABS: ORAL_TABLET | ORAL | Status: DC

## 2014-10-21 DIAGNOSIS — H40003 Preglaucoma, unspecified, bilateral: Secondary | ICD-10-CM | POA: Diagnosis not present

## 2014-11-19 DIAGNOSIS — D2261 Melanocytic nevi of right upper limb, including shoulder: Secondary | ICD-10-CM | POA: Diagnosis not present

## 2014-11-19 DIAGNOSIS — C4441 Basal cell carcinoma of skin of scalp and neck: Secondary | ICD-10-CM | POA: Diagnosis not present

## 2014-11-19 DIAGNOSIS — L821 Other seborrheic keratosis: Secondary | ICD-10-CM | POA: Diagnosis not present

## 2014-11-19 DIAGNOSIS — D485 Neoplasm of uncertain behavior of skin: Secondary | ICD-10-CM | POA: Diagnosis not present

## 2014-11-19 DIAGNOSIS — D225 Melanocytic nevi of trunk: Secondary | ICD-10-CM | POA: Diagnosis not present

## 2014-12-18 DIAGNOSIS — C4441 Basal cell carcinoma of skin of scalp and neck: Secondary | ICD-10-CM | POA: Diagnosis not present

## 2014-12-18 DIAGNOSIS — L905 Scar conditions and fibrosis of skin: Secondary | ICD-10-CM | POA: Diagnosis not present

## 2015-01-03 ENCOUNTER — Other Ambulatory Visit: Payer: Self-pay | Admitting: Family Medicine

## 2015-03-05 ENCOUNTER — Other Ambulatory Visit: Payer: Self-pay

## 2015-03-05 DIAGNOSIS — Z1231 Encounter for screening mammogram for malignant neoplasm of breast: Secondary | ICD-10-CM

## 2015-03-10 ENCOUNTER — Other Ambulatory Visit (INDEPENDENT_AMBULATORY_CARE_PROVIDER_SITE_OTHER): Payer: Medicare Other

## 2015-03-10 ENCOUNTER — Other Ambulatory Visit: Payer: Self-pay | Admitting: Family Medicine

## 2015-03-10 DIAGNOSIS — E785 Hyperlipidemia, unspecified: Secondary | ICD-10-CM

## 2015-03-10 LAB — CBC WITH DIFFERENTIAL/PLATELET
Basophils Absolute: 0 10*3/uL (ref 0.0–0.1)
Basophils Relative: 0.6 % (ref 0.0–3.0)
EOS PCT: 1 % (ref 0.0–5.0)
Eosinophils Absolute: 0.1 10*3/uL (ref 0.0–0.7)
HCT: 42.6 % (ref 36.0–46.0)
Hemoglobin: 14.5 g/dL (ref 12.0–15.0)
LYMPHS ABS: 2 10*3/uL (ref 0.7–4.0)
Lymphocytes Relative: 33.1 % (ref 12.0–46.0)
MCHC: 34 g/dL (ref 30.0–36.0)
MCV: 95.7 fl (ref 78.0–100.0)
Monocytes Absolute: 0.7 10*3/uL (ref 0.1–1.0)
Monocytes Relative: 11.1 % (ref 3.0–12.0)
NEUTROS ABS: 3.2 10*3/uL (ref 1.4–7.7)
NEUTROS PCT: 54.2 % (ref 43.0–77.0)
Platelets: 214 10*3/uL (ref 150.0–400.0)
RBC: 4.46 Mil/uL (ref 3.87–5.11)
RDW: 13.5 % (ref 11.5–15.5)
WBC: 6 10*3/uL (ref 4.0–10.5)

## 2015-03-10 LAB — TSH: TSH: 1.66 u[IU]/mL (ref 0.35–4.50)

## 2015-03-10 LAB — COMPREHENSIVE METABOLIC PANEL
ALT: 19 U/L (ref 0–35)
AST: 21 U/L (ref 0–37)
Albumin: 4.4 g/dL (ref 3.5–5.2)
Alkaline Phosphatase: 50 U/L (ref 39–117)
BILIRUBIN TOTAL: 0.8 mg/dL (ref 0.2–1.2)
BUN: 13 mg/dL (ref 6–23)
CHLORIDE: 104 meq/L (ref 96–112)
CO2: 28 mEq/L (ref 19–32)
CREATININE: 0.86 mg/dL (ref 0.40–1.20)
Calcium: 10.4 mg/dL (ref 8.4–10.5)
GFR: 68.12 mL/min (ref >60.00)
GLUCOSE: 104 mg/dL — AB (ref 70–99)
POTASSIUM: 4 meq/L (ref 3.5–5.1)
Sodium: 139 mEq/L (ref 135–145)
TOTAL PROTEIN: 7.4 g/dL (ref 6.0–8.3)

## 2015-03-10 LAB — LIPID PANEL
CHOLESTEROL: 155 mg/dL (ref 0–200)
HDL: 33.7 mg/dL — ABNORMAL LOW (ref >39.00)
LDL Cholesterol: 101 mg/dL — ABNORMAL HIGH (ref 0–99)
NonHDL: 121.3
Total CHOL/HDL Ratio: 5
Triglycerides: 100 mg/dL (ref 0.0–149.0)
VLDL: 20 mg/dL (ref 0.0–40.0)

## 2015-03-12 ENCOUNTER — Encounter: Payer: Self-pay | Admitting: Family Medicine

## 2015-03-12 ENCOUNTER — Ambulatory Visit (INDEPENDENT_AMBULATORY_CARE_PROVIDER_SITE_OTHER): Payer: Medicare Other | Admitting: Family Medicine

## 2015-03-12 VITALS — BP 158/88 | HR 72 | Temp 98.3°F | Wt 157.5 lb

## 2015-03-12 DIAGNOSIS — I10 Essential (primary) hypertension: Secondary | ICD-10-CM

## 2015-03-12 DIAGNOSIS — E785 Hyperlipidemia, unspecified: Secondary | ICD-10-CM | POA: Diagnosis not present

## 2015-03-12 NOTE — Progress Notes (Signed)
74 yo pleasant female here for follow up.   HTN-  Currently taking amlodipine 5 mg daily.  Does get occassional swelling in her feet from the amlodipine.  no HA, blurred vision, CP, SOB.   Elevated here- h/o white coat HTN.  Usually below 140/80 at home. Lab Results  Component Value Date   CREATININE 0.86 03/10/2015    HLD- chronic low HDL, exercises 6 days per week and HLD has improved this month.  Takes Simvastatin 40 mg and Fish oil 1000 mg daily.  Lab Results  Component Value Date   CHOL 155 03/10/2015   HDL 33.70* 03/10/2015   LDLCALC 101* 03/10/2015   TRIG 100.0 03/10/2015   CHOLHDL 5 03/10/2015    H/o breast CA- 1998- s/p right lumpectomy and radiation.  No recurrence.  UTD on mammogram  (04/06/2014)  Lab Results  Component Value Date   ALT 19 03/10/2015   AST 21 03/10/2015   ALKPHOS 50 03/10/2015   BILITOT 0.8 03/10/2015   Lab Results  Component Value Date   TSH 1.66 03/10/2015     Patient Active Problem List   Diagnosis Date Noted  . HLD (hyperlipidemia) 02/15/2010  . 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 OSTEOPENIA 02/15/2010  . BREAST CANCER, HX OF 02/15/2010  . COLONIC POLYPS, HX OF 02/15/2010  . VITAMIN D DEFICIENCY 02/09/2010  . PURE HYPERCHOLESTEROLEMIA 02/09/2010  . Essential hypertension 02/09/2010   Past Medical History  Diagnosis Date  . Cancer     breast  . Hyperlipidemia   . Hypertension   . Colonic polyp    Past Surgical History  Procedure Laterality Date  . Breast lumpectomy     History  Substance Use Topics  . Smoking status: Never Smoker   . Smokeless tobacco: Not on file  . Alcohol Use: No   Family History  Problem Relation Age of Onset  . Cancer Mother   . Cancer Father    Allergies  Allergen Reactions  . Codeine     REACTION: Nigthmares   Current Outpatient Prescriptions on File Prior to Visit  Medication Sig Dispense Refill  . amLODipine (NORVASC) 5 MG tablet TAKE 1 TABLET (5 MG TOTAL) BY MOUTH DAILY. 90 tablet 1  .  Calcium 1200-1000 MG-UNIT CHEW Chew 1 capsule by mouth daily. 90 each 3  . Cholecalciferol (VITAMIN D3) 1000 UNITS CAPS Take 1 capsule by mouth daily. 90 capsule 3  . Multiple Vitamins-Minerals (OCUVITE PRESERVISION) TABS 1 tablet 2 (two) times daily. Use as directed    . Omega-3 Fatty Acids (FISH OIL) 1000 MG CAPS Take 1 capsule (1,000 mg total) by mouth daily. 90 capsule 3  . simvastatin (ZOCOR) 40 MG tablet TAKE 1 TABLET (40 MG TOTAL) BY MOUTH AT BEDTIME. 90 tablet 1   No current facility-administered medications on file prior to visit.   The PMH, PSH, Social History, Family History, Medications, and allergies have been reviewed in [facility], and have been updated if relevant.  Review of Systems   Patient reports no  vision/ hearing changes,anorexia, weight change, fever ,adenopathy, persistant / recurrent hoarseness, swallowing issues, chest pain, edema,persistant / recurrent cough, hemoptysis, dyspnea(rest, exertional, paroxysmal nocturnal), gastrointestinal  bleeding (melena, rectal bleeding), abdominal pain, excessive heart burn, GU symptoms(dysuria, hematuria, pyuria, voiding/incontinence  Issues) syncope, focal weakness, severe memory loss, concerning skin lesions, depression, anxiety, abnormal bruising/bleeding, major joint swelling, breast masses or abnormal vaginal bleeding.     Physical Exam BP 158/88 mmHg  Pulse 72  Temp(Src) 98.3 F (36.8 C) (Oral)  Wt 157 lb 8 oz (71.442 kg)  SpO2 98%  General:  Well-developed,well-nourished,in no acute distress; alert,appropriate and cooperative throughout examination Head:  normocephalic, atraumatic, and no abnormalities observed.   Eyes:  vision grossly intact, pupils equal, and pupils round.   Mouth:  good dentition.   Lungs:  Normal respiratory effort, chest expands symmetrically. Lungs are clear to auscultation, no crackles or wheezes. Heart:  Normal rate and regular rhythm. S1 and S2 normal without gallop, murmur, click, rub or other extra  sounds. Abdomen:  Bowel sounds positive,abdomen soft and non-tender without masses, organomegaly or hernias noted. Extremities:  no edema Neurologic:  alert & oriented X3.   Skin:  seborrheic keratosis.   Psych:  memory intact for recent and remote, normally interactive, good eye contact, and not anxious appearing.

## 2015-03-12 NOTE — Patient Instructions (Signed)
Wonderful to see you! Please come see me in 6 months.

## 2015-03-12 NOTE — Assessment & Plan Note (Signed)
Well controlled on current rx. No changes made today. 

## 2015-03-12 NOTE — Progress Notes (Signed)
Pre visit review using our clinic review tool, if applicable. No additional management support is needed unless otherwise documented below in the visit note. 

## 2015-03-12 NOTE — Assessment & Plan Note (Signed)
History of white coat HTN.  Well controlled.  No changes made.

## 2015-03-23 DIAGNOSIS — H3531 Nonexudative age-related macular degeneration: Secondary | ICD-10-CM | POA: Diagnosis not present

## 2015-03-26 DIAGNOSIS — Z85828 Personal history of other malignant neoplasm of skin: Secondary | ICD-10-CM | POA: Diagnosis not present

## 2015-03-26 DIAGNOSIS — L821 Other seborrheic keratosis: Secondary | ICD-10-CM | POA: Diagnosis not present

## 2015-04-01 ENCOUNTER — Other Ambulatory Visit: Payer: Self-pay | Admitting: Family Medicine

## 2015-04-07 ENCOUNTER — Ambulatory Visit
Admission: RE | Admit: 2015-04-07 | Discharge: 2015-04-07 | Disposition: A | Discharge status: Home / Self Care | Payer: Medicare Other | Source: Ambulatory Visit

## 2015-04-07 DIAGNOSIS — Z1231 Encounter for screening mammogram for malignant neoplasm of breast: Secondary | ICD-10-CM | POA: Diagnosis not present

## 2015-04-22 DIAGNOSIS — H40003 Preglaucoma, unspecified, bilateral: Secondary | ICD-10-CM | POA: Diagnosis not present

## 2015-04-23 DIAGNOSIS — H40003 Preglaucoma, unspecified, bilateral: Secondary | ICD-10-CM | POA: Diagnosis not present

## 2015-06-29 ENCOUNTER — Other Ambulatory Visit: Payer: Self-pay | Admitting: Family Medicine

## 2015-07-28 DIAGNOSIS — Z23 Encounter for immunization: Secondary | ICD-10-CM | POA: Diagnosis not present

## 2015-09-01 ENCOUNTER — Other Ambulatory Visit: Payer: Self-pay | Admitting: Family Medicine

## 2015-09-01 DIAGNOSIS — I10 Essential (primary) hypertension: Secondary | ICD-10-CM

## 2015-09-01 DIAGNOSIS — E559 Vitamin D deficiency, unspecified: Secondary | ICD-10-CM

## 2015-09-01 DIAGNOSIS — E785 Hyperlipidemia, unspecified: Secondary | ICD-10-CM

## 2015-09-07 ENCOUNTER — Other Ambulatory Visit (INDEPENDENT_AMBULATORY_CARE_PROVIDER_SITE_OTHER): Payer: Medicare Other

## 2015-09-07 DIAGNOSIS — E559 Vitamin D deficiency, unspecified: Secondary | ICD-10-CM

## 2015-09-07 DIAGNOSIS — E785 Hyperlipidemia, unspecified: Secondary | ICD-10-CM

## 2015-09-07 DIAGNOSIS — I10 Essential (primary) hypertension: Secondary | ICD-10-CM | POA: Diagnosis not present

## 2015-09-07 LAB — CBC WITH DIFFERENTIAL/PLATELET
BASOS PCT: 0.6 % (ref 0.0–3.0)
Basophils Absolute: 0 10*3/uL (ref 0.0–0.1)
EOS PCT: 1.4 % (ref 0.0–5.0)
Eosinophils Absolute: 0.1 10*3/uL (ref 0.0–0.7)
HCT: 42.8 % (ref 36.0–46.0)
HEMOGLOBIN: 14.4 g/dL (ref 12.0–15.0)
Lymphocytes Relative: 44.9 % (ref 12.0–46.0)
Lymphs Abs: 2.5 10*3/uL (ref 0.7–4.0)
MCHC: 33.6 g/dL (ref 30.0–36.0)
MCV: 95.7 fl (ref 78.0–100.0)
MONO ABS: 0.6 10*3/uL (ref 0.1–1.0)
Monocytes Relative: 11.5 % (ref 3.0–12.0)
NEUTROS ABS: 2.3 10*3/uL (ref 1.4–7.7)
Neutrophils Relative %: 41.6 % — ABNORMAL LOW (ref 43.0–77.0)
Platelets: 221 10*3/uL (ref 150.0–400.0)
RBC: 4.47 Mil/uL (ref 3.87–5.11)
RDW: 13.2 % (ref 11.5–15.5)
WBC: 5.5 10*3/uL (ref 4.0–10.5)

## 2015-09-07 LAB — COMPREHENSIVE METABOLIC PANEL
ALBUMIN: 4.1 g/dL (ref 3.5–5.2)
ALT: 16 U/L (ref 0–35)
AST: 17 U/L (ref 0–37)
Alkaline Phosphatase: 48 U/L (ref 39–117)
BUN: 13 mg/dL (ref 6–23)
CHLORIDE: 107 meq/L (ref 96–112)
CO2: 28 mEq/L (ref 19–32)
CREATININE: 0.8 mg/dL (ref 0.40–1.20)
Calcium: 10.1 mg/dL (ref 8.4–10.5)
GFR: 73.95 mL/min (ref >60.00)
Glucose, Bld: 102 mg/dL — ABNORMAL HIGH (ref 70–99)
Potassium: 4.2 mEq/L (ref 3.5–5.1)
SODIUM: 142 meq/L (ref 135–145)
TOTAL PROTEIN: 7.2 g/dL (ref 6.0–8.3)
Total Bilirubin: 0.6 mg/dL (ref 0.2–1.2)

## 2015-09-07 LAB — TSH: TSH: 2.17 u[IU]/mL (ref 0.35–4.50)

## 2015-09-07 LAB — LIPID PANEL
CHOL/HDL RATIO: 5
Cholesterol: 143 mg/dL (ref 0–200)
HDL: 30.2 mg/dL — AB (ref >39.00)
LDL Cholesterol: 92 mg/dL (ref 0–99)
NonHDL: 112.83
TRIGLYCERIDES: 105 mg/dL (ref 0.0–149.0)
VLDL: 21 mg/dL (ref 0.0–40.0)

## 2015-09-07 LAB — VITAMIN D 25 HYDROXY (VIT D DEFICIENCY, FRACTURES): VITD: 25.56 ng/mL — ABNORMAL LOW (ref 30.00–100.00)

## 2015-09-14 ENCOUNTER — Ambulatory Visit (INDEPENDENT_AMBULATORY_CARE_PROVIDER_SITE_OTHER): Payer: Medicare Other | Admitting: Family Medicine

## 2015-09-14 ENCOUNTER — Encounter: Payer: Self-pay | Admitting: Family Medicine

## 2015-09-14 VITALS — BP 132/82 | HR 71 | Temp 97.9°F | Ht 63.0 in | Wt 159.2 lb

## 2015-09-14 DIAGNOSIS — E559 Vitamin D deficiency, unspecified: Secondary | ICD-10-CM | POA: Diagnosis not present

## 2015-09-14 DIAGNOSIS — E785 Hyperlipidemia, unspecified: Secondary | ICD-10-CM | POA: Diagnosis not present

## 2015-09-14 DIAGNOSIS — M949 Disorder of cartilage, unspecified: Secondary | ICD-10-CM

## 2015-09-14 DIAGNOSIS — M899 Disorder of bone, unspecified: Secondary | ICD-10-CM | POA: Diagnosis not present

## 2015-09-14 DIAGNOSIS — Z0000 Encounter for general adult medical examination without abnormal findings: Secondary | ICD-10-CM | POA: Diagnosis not present

## 2015-09-14 DIAGNOSIS — Z853 Personal history of malignant neoplasm of breast: Secondary | ICD-10-CM

## 2015-09-14 DIAGNOSIS — I10 Essential (primary) hypertension: Secondary | ICD-10-CM | POA: Diagnosis not present

## 2015-09-14 DIAGNOSIS — Z86010 Personal history of colonic polyps: Secondary | ICD-10-CM

## 2015-09-14 NOTE — Assessment & Plan Note (Signed)
Deteriorated. Advised to increase Vit D to 1000 IU daily.

## 2015-09-14 NOTE — Assessment & Plan Note (Addendum)
Due for DEXA. Will order today.  

## 2015-09-14 NOTE — Progress Notes (Signed)
74 yo pleasant female here for annual wellness visit.  I have personally reviewed the Medicare Annual Wellness questionnaire and have noted 1. The patient's medical and social history 2. Their use of alcohol, tobacco or illicit drugs 3. Their current medications and supplements 4. The patient's functional ability including ADL's, fall risks, home safety risks and hearing or visual             impairment. 5. Diet and physical activities 6. Evidence for depression or mood disorders  End of life wishes discussed and updated in Social History.  The roster of all physicians providing medical care to patient - is listed in the Snapshot section of the chart.  Colonoscopy 10/25/13 ([place])- 5 year recall Prevnar 13 05/26/14 Td 02/11/09 Zoster 02/15/10 DEXA- 06/27/12- normal Eye exam 04/23/15 Influenza vaccine 08/02/15 Dental visit 07/22/15 Derm exam 03/26/15 Mammogram 04/07/15  LMP over 25 years ago. No post menopausl bleeding.  Vit D deficiency- deteriorated- Vit D now 25.56.  Has been a little more fatigued.  HTN-.  Currently taking amlodipine 5 mg daily.    no HA, blurred vision, CP, SOB.   Did try to stop taking it and had to restart it due to elevated BP readings.  HLD- chronic low HDL, exercises 6 days per week.  Takes Simvastatin 40 mg and Fish oil 1000 mg daily.  Lab Results  Component Value Date   CHOL 143 09/07/2015   HDL 30.20* 09/07/2015   LDLCALC 92 09/07/2015   TRIG 105.0 09/07/2015   CHOLHDL 5 09/07/2015    Osteopenia- last bone density 06/2012 was normal.   No fractures, had been on Fosamax for almost 5 years so we d/c'd it two years ago.  She is taking calcium vitamin D and exercises at the gym 5 days per week.  H/o breast CA- 1998- s/p right lumpectomy and radiation.  No recurrence.  UTD on mammogram.    Patient Active Problem List   Diagnosis Date Noted  . Medicare annual wellness visit, subsequent 09/14/2015  . HLD (hyperlipidemia) 02/15/2010  . SEBORRHEIC  KERATOSIS 02/15/2010  . Disorder of bone and cartilage 02/15/2010  . BREAST CANCER, HX OF 02/15/2010  . COLONIC POLYPS, HX OF 02/15/2010  . Vitamin D deficiency 02/09/2010  . PURE HYPERCHOLESTEROLEMIA 02/09/2010  . Essential hypertension 02/09/2010   Past Medical History  Diagnosis Date  . Cancer ([place])     breast  . Hyperlipidemia   . Hypertension   . Colonic polyp    Past Surgical History  Procedure Laterality Date  . Breast lumpectomy     Social History  Substance Use Topics  . Smoking status: Never Smoker   . Smokeless tobacco: None  . Alcohol Use: No   Family History  Problem Relation Age of Onset  . Cancer Mother   . Cancer Father    Allergies  Allergen Reactions  . Codeine     REACTION: Nigthmares   Current Outpatient Prescriptions on File Prior to Visit  Medication Sig Dispense Refill  . amLODipine (NORVASC) 5 MG tablet TAKE 1 TABLET (5 MG TOTAL) BY MOUTH DAILY. 90 tablet 2  . Calcium 1200-1000 MG-UNIT CHEW Chew 1 capsule by mouth daily. 90 each 3  . Cholecalciferol (VITAMIN D3) 1000 UNITS CAPS Take 1 capsule by mouth daily. 90 capsule 3  . Multiple Vitamins-Minerals (OCUVITE PRESERVISION) TABS 1 tablet 2 (two) times daily. Use as directed    . Omega-3 Fatty Acids (FISH OIL) 1000 MG CAPS Take 1 capsule (1,000 mg total) by  mouth daily. 90 capsule 3  . simvastatin (ZOCOR) 40 MG tablet TAKE 1 TABLET (40 MG TOTAL) BY MOUTH AT BEDTIME. 90 tablet 1   No current facility-administered medications on file prior to visit.   The PMH, PSH, Social History, Family History, Medications, and allergies have been reviewed in [facility], and have been updated if relevant.  Review of Systems  Constitutional: Positive for fatigue.  HENT: Negative.   Eyes: Negative.   Respiratory: Negative.   Cardiovascular: Negative.   Gastrointestinal: Negative.   Endocrine: Negative.   Genitourinary: Negative.   Musculoskeletal: Negative.   Skin: Negative.   Allergic/Immunologic: Negative.    Neurological: Negative.   Hematological: Negative.   Psychiatric/Behavioral: Negative.       Physical Exam BP 132/82 mmHg  Pulse 71  Temp(Src) 97.9 F (36.6 C) (Oral)  Ht 5\' 3"  (1.6 m)  Wt 159 lb 4 oz (72.235 kg)  BMI 28.22 kg/m2  SpO2 97%  General:  Well-developed,well-nourished,in no acute distress; alert,appropriate and cooperative throughout examination Head:  normocephalic, atraumatic, and no abnormalities observed.   Eyes:  vision grossly intact, pupils equal, and pupils round.   Mouth:  good dentition.   Lungs:  Normal respiratory effort, chest expands symmetrically. Lungs are clear to auscultation, no crackles or wheezes. Heart:  Normal rate and regular rhythm. S1 and S2 normal without gallop, murmur, click, rub or other extra sounds. Abdomen:  Bowel sounds positive,abdomen soft and non-tender without masses, organomegaly or hernias noted. Extremities:  +thick ankles, non pitting Neurologic:  alert & oriented X3.   Skin:  seborrheic keratosis.   Psych:  memory intact for recent and remote, normally interactive, good eye contact, and not anxious appearing.

## 2015-09-14 NOTE — Assessment & Plan Note (Signed)
The patients weight, height, BMI and visual acuity have been recorded in the chart.  Cognitive function assessed.   I have made referrals, counseling and provided education to the patient based review of the above and I have provided the pt with a written personalized care plan for preventive services.  

## 2015-09-14 NOTE — Assessment & Plan Note (Signed)
Well controlled with current rxs. No changes made today. 

## 2015-09-14 NOTE — Progress Notes (Signed)
Pre visit review using our clinic review tool, if applicable. No additional management support is needed unless otherwise documented below in the visit note. 

## 2015-09-14 NOTE — Assessment & Plan Note (Signed)
Well controlled on current rx. No changes made today. 

## 2015-09-14 NOTE — Patient Instructions (Addendum)
  Great to see you. Happy Holidays and have a happy birthday!  Please increase your Vit D capsules to 3 daily.  Please stop by to see Rosaria Ferries on your way out to set up your bone density.

## 2015-09-17 ENCOUNTER — Other Ambulatory Visit: Payer: Self-pay | Admitting: Family Medicine

## 2015-10-15 ENCOUNTER — Encounter: Payer: Self-pay | Admitting: *Deleted

## 2015-10-15 ENCOUNTER — Ambulatory Visit
Admission: RE | Admit: 2015-10-15 | Discharge: 2015-10-15 | Disposition: A | Discharge status: Home / Self Care | Payer: Medicare Other | Source: Ambulatory Visit | Attending: Family Medicine | Admitting: Family Medicine

## 2015-10-15 DIAGNOSIS — E559 Vitamin D deficiency, unspecified: Secondary | ICD-10-CM

## 2015-10-15 DIAGNOSIS — M899 Disorder of bone, unspecified: Secondary | ICD-10-CM

## 2015-10-15 DIAGNOSIS — M949 Disorder of cartilage, unspecified: Principal | ICD-10-CM

## 2015-10-15 DIAGNOSIS — M8589 Other specified disorders of bone density and structure, multiple sites: Secondary | ICD-10-CM | POA: Diagnosis not present

## 2015-11-18 DIAGNOSIS — L821 Other seborrheic keratosis: Secondary | ICD-10-CM | POA: Diagnosis not present

## 2015-11-18 DIAGNOSIS — D1801 Hemangioma of skin and subcutaneous tissue: Secondary | ICD-10-CM | POA: Diagnosis not present

## 2015-11-18 DIAGNOSIS — D485 Neoplasm of uncertain behavior of skin: Secondary | ICD-10-CM | POA: Diagnosis not present

## 2015-11-18 DIAGNOSIS — Z85828 Personal history of other malignant neoplasm of skin: Secondary | ICD-10-CM | POA: Diagnosis not present

## 2015-11-18 DIAGNOSIS — D2239 Melanocytic nevi of other parts of face: Secondary | ICD-10-CM | POA: Diagnosis not present

## 2015-11-18 DIAGNOSIS — Z08 Encounter for follow-up examination after completed treatment for malignant neoplasm: Secondary | ICD-10-CM | POA: Diagnosis not present

## 2016-03-02 ENCOUNTER — Other Ambulatory Visit: Payer: Self-pay

## 2016-03-02 DIAGNOSIS — Z1231 Encounter for screening mammogram for malignant neoplasm of breast: Secondary | ICD-10-CM

## 2016-03-12 ENCOUNTER — Other Ambulatory Visit: Payer: Self-pay | Admitting: Family Medicine

## 2016-04-06 ENCOUNTER — Other Ambulatory Visit: Payer: Self-pay | Admitting: Family Medicine

## 2016-04-07 ENCOUNTER — Ambulatory Visit
Admission: RE | Admit: 2016-04-07 | Discharge: 2016-04-07 | Disposition: A | Discharge status: Home / Self Care | Payer: Medicare Other | Source: Ambulatory Visit

## 2016-04-07 DIAGNOSIS — Z1231 Encounter for screening mammogram for malignant neoplasm of breast: Secondary | ICD-10-CM

## 2016-05-04 ENCOUNTER — Ambulatory Visit (INDEPENDENT_AMBULATORY_CARE_PROVIDER_SITE_OTHER): Payer: Medicare Other | Admitting: Family Medicine

## 2016-05-04 DIAGNOSIS — I10 Essential (primary) hypertension: Secondary | ICD-10-CM

## 2016-05-04 DIAGNOSIS — M899 Disorder of bone, unspecified: Secondary | ICD-10-CM

## 2016-05-04 DIAGNOSIS — E785 Hyperlipidemia, unspecified: Secondary | ICD-10-CM

## 2016-05-04 DIAGNOSIS — Z853 Personal history of malignant neoplasm of breast: Secondary | ICD-10-CM

## 2016-05-04 DIAGNOSIS — M949 Disorder of cartilage, unspecified: Secondary | ICD-10-CM

## 2016-05-04 NOTE — Progress Notes (Signed)
Appt cancelled

## 2016-05-04 NOTE — Assessment & Plan Note (Signed)
Mammogram UTD. 

## 2016-05-04 NOTE — Progress Notes (Deleted)
75 yo pleasant female here for follow up.  HTN-  Currently taking amlodipine 5 mg daily.    No HA, blurred vision, CP, SOB.   Did try to stop taking it and had to restart it due to elevated BP readings.  Lab Results  Component Value Date   CREATININE 0.80 09/07/2015     HLD- chronic low HDL, exercises 6 days per week.  Takes Simvastatin 40 mg and Fish oil 1000 mg daily.  Lab Results  Component Value Date   CHOL 143 09/07/2015   HDL 30.20 (L) 09/07/2015   LDLCALC 92 09/07/2015   TRIG 105.0 09/07/2015   CHOLHDL 5 09/07/2015    Osteopenia- last bone density 10/15/15- osteopenia   No fractures, had been on Fosamax for almost 5 years so we d/c'd it a few years ago.  She is taking calcium vitamin D and exercises at the gym 5 days per week.  H/o breast CA- 1998- s/p right lumpectomy and radiation.  No recurrence.  UTD on mammogram- 04/08/16.    Patient Active Problem List  Diagnosis  . Vitamin D deficiency  . HLD (hyperlipidemia)  . Essential hypertension  . SEBORRHEIC KERATOSIS  . Disorder of bone and cartilage  . BREAST CANCER, HX OF  . COLONIC POLYPS, HX OF   Past Medical History:  Diagnosis Date  . Cancer [facility])    breast  . Colonic polyp   . Hyperlipidemia   . Hypertension    Past Surgical History:  Procedure Laterality Date  . BREAST LUMPECTOMY     Social History  Substance Use Topics  . Smoking status: Never Smoker  . Smokeless tobacco: Not on file  . Alcohol use No   Family History  Problem Relation Age of Onset  . Cancer Mother   . Cancer Father    Allergies  Allergen Reactions  . Codeine     REACTION: Nigthmares   Current Outpatient Prescriptions on File Prior to Visit  Medication Sig Dispense Refill  . amLODipine (NORVASC) 5 MG tablet TAKE 1 TABLET (5 MG TOTAL) BY MOUTH DAILY. 90 tablet 1  . Calcium 1200-1000 MG-UNIT CHEW Chew 1 capsule by mouth daily. 90 each 3  . Cholecalciferol (VITAMIN D3) 1000 UNITS CAPS Take 1 capsule by mouth daily. 90  capsule 3  . Multiple Vitamins-Minerals (OCUVITE PRESERVISION) TABS 1 tablet 2 (two) times daily. Use as directed    . Omega-3 Fatty Acids (FISH OIL) 1000 MG CAPS Take 1 capsule (1,000 mg total) by mouth daily. 90 capsule 3  . simvastatin (ZOCOR) 40 MG tablet TAKE 1 TABLET (40 MG TOTAL) BY MOUTH AT BEDTIME. 90 tablet 1   No current facility-administered medications on file prior to visit.    The PMH, PSH, Social History, Family History, Medications, and allergies have been reviewed in [facility], and have been updated if relevant.  Review of Systems  Constitutional: Positive for fatigue.  HENT: Negative.   Eyes: Negative.   Respiratory: Negative.   Cardiovascular: Negative.   Gastrointestinal: Negative.   Endocrine: Negative.   Genitourinary: Negative.   Musculoskeletal: Negative.   Skin: Negative.   Allergic/Immunologic: Negative.   Neurological: Negative.   Hematological: Negative.   Psychiatric/Behavioral: Negative.       Physical Exam There were no vitals taken for this visit.  General:  Well-developed,well-nourished,in no acute distress; alert,appropriate and cooperative throughout examination Head:  normocephalic, atraumatic, and no abnormalities observed.   Eyes:  vision grossly intact, pupils equal, and pupils round.   Mouth:  good dentition.   Lungs:  Normal respiratory effort, chest expands symmetrically. Lungs are clear to auscultation, no crackles or wheezes. Heart:  Normal rate and regular rhythm. S1 and S2 normal without gallop, murmur, click, rub or other extra sounds. Abdomen:  Bowel sounds positive,abdomen soft and non-tender without masses, organomegaly or hernias noted. Extremities:  +thick ankles, non pitting Neurologic:  alert & oriented X3.   Skin:  seborrheic keratosis.   Psych:  memory intact for recent and remote, normally interactive, good eye contact, and not anxious appearing.

## 2016-05-04 NOTE — Assessment & Plan Note (Deleted)
Osteopenia on recent DEXA. Continue adequate calcium, vit D intake and weight bearing exercises.

## 2016-05-04 NOTE — Assessment & Plan Note (Deleted)
Well controlled on current rxs. No changes made today. 

## 2016-06-10 DIAGNOSIS — H4423 Degenerative myopia, bilateral: Secondary | ICD-10-CM | POA: Diagnosis not present

## 2016-06-14 DIAGNOSIS — H40003 Preglaucoma, unspecified, bilateral: Secondary | ICD-10-CM | POA: Diagnosis not present

## 2016-07-11 DIAGNOSIS — Z23 Encounter for immunization: Secondary | ICD-10-CM | POA: Diagnosis not present

## 2016-09-09 ENCOUNTER — Other Ambulatory Visit: Payer: Self-pay

## 2016-09-10 ENCOUNTER — Other Ambulatory Visit: Payer: Self-pay | Admitting: Family Medicine

## 2016-09-13 ENCOUNTER — Other Ambulatory Visit: Payer: Self-pay | Admitting: Family Medicine

## 2016-09-13 DIAGNOSIS — Z01419 Encounter for gynecological examination (general) (routine) without abnormal findings: Secondary | ICD-10-CM

## 2016-09-13 DIAGNOSIS — E7800 Pure hypercholesterolemia, unspecified: Secondary | ICD-10-CM

## 2016-09-13 DIAGNOSIS — E559 Vitamin D deficiency, unspecified: Secondary | ICD-10-CM

## 2016-09-15 ENCOUNTER — Other Ambulatory Visit: Payer: Medicare Other

## 2016-09-15 ENCOUNTER — Ambulatory Visit (INDEPENDENT_AMBULATORY_CARE_PROVIDER_SITE_OTHER): Payer: Medicare Other

## 2016-09-15 VITALS — BP 140/80 | HR 78 | Temp 98.5°F | Ht 63.0 in | Wt 158.0 lb

## 2016-09-15 DIAGNOSIS — Z0000 Encounter for general adult medical examination without abnormal findings: Secondary | ICD-10-CM

## 2016-09-15 DIAGNOSIS — Z23 Encounter for immunization: Secondary | ICD-10-CM

## 2016-09-15 DIAGNOSIS — E550 Rickets, active: Secondary | ICD-10-CM | POA: Diagnosis not present

## 2016-09-15 DIAGNOSIS — Z01419 Encounter for gynecological examination (general) (routine) without abnormal findings: Secondary | ICD-10-CM

## 2016-09-15 LAB — COMPREHENSIVE METABOLIC PANEL
ALT: 30 U/L (ref 0–35)
AST: 26 U/L (ref 0–37)
Albumin: 4.6 g/dL (ref 3.5–5.2)
Alkaline Phosphatase: 48 U/L (ref 39–117)
BILIRUBIN TOTAL: 1 mg/dL (ref 0.2–1.2)
BUN: 14 mg/dL (ref 6–23)
CALCIUM: 10.7 mg/dL — AB (ref 8.4–10.5)
CHLORIDE: 104 meq/L (ref 96–112)
CO2: 30 meq/L (ref 19–32)
Creatinine, Ser: 0.85 mg/dL (ref 0.40–1.20)
GFR: 68.77 mL/min (ref >60.00)
Glucose, Bld: 101 mg/dL — ABNORMAL HIGH (ref 70–99)
POTASSIUM: 4.2 meq/L (ref 3.5–5.1)
Sodium: 141 mEq/L (ref 135–145)
Total Protein: 7.6 g/dL (ref 6.0–8.3)

## 2016-09-15 LAB — LIPID PANEL
CHOL/HDL RATIO: 5
Cholesterol: 161 mg/dL (ref 0–200)
HDL: 35.3 mg/dL — AB (ref >39.00)
LDL CALC: 104 mg/dL — AB (ref 0–99)
NonHDL: 126.11
TRIGLYCERIDES: 112 mg/dL (ref 0.0–149.0)
VLDL: 22.4 mg/dL (ref 0.0–40.0)

## 2016-09-15 LAB — VITAMIN D 25 HYDROXY (VIT D DEFICIENCY, FRACTURES): VITD: 31.5 ng/mL (ref 30.00–100.00)

## 2016-09-15 LAB — TSH: TSH: 1.63 u[IU]/mL (ref 0.35–4.50)

## 2016-09-15 NOTE — Progress Notes (Signed)
Pre visit review using our clinic review tool, if applicable. No additional management support is needed unless otherwise documented below in the visit note. 

## 2016-09-15 NOTE — Progress Notes (Signed)
PCP notes:   Health maintenance:  PPSV23 - administered Flu vaccine - per pt, administered 07/11/16  Abnormal screenings:   Hearing - failed  Patient concerns:   None  Nurse concerns:  None  Next PCP appt:   09/19/16 @ 0915

## 2016-09-15 NOTE — Progress Notes (Signed)
I reviewed health advisor's note, was available for consultation, and agree with documentation and plan.  

## 2016-09-15 NOTE — Patient Instructions (Addendum)
Heidi Sanchez , Thank you for taking time to come for your Medicare Wellness Visit. I appreciate your ongoing commitment to your health goals. Please review the following plan we discussed and let me know if I can assist you in the future.   These are the goals we discussed: Goals    . Increase physical activity          Starting 09/19/16, I will continue to exercise at least 60 min 4 days per week.        This is a list of the screening recommended for you and due dates:  Health Maintenance  Topic Date Due  . Tetanus Vaccine  02/12/2019  . Flu Shot  Addressed  . DEXA scan (bone density measurement)  Completed  . Shingles Vaccine  Completed  . Pneumonia vaccines  Completed   Preventive Care for Adults  A healthy lifestyle and preventive care can promote health and wellness. Preventive health guidelines for adults include the following key practices.  . A routine yearly physical is a good way to check with your health care provider about your health and preventive screening. It is a chance to share any concerns and updates on your health and to receive a thorough exam.  . Visit your dentist for a routine exam and preventive care every 6 months. Brush your teeth twice a day and floss once a day. Good oral hygiene prevents tooth decay and gum disease.  . The frequency of eye exams is based on your age, health, family medical history, use  of contact lenses, and other factors. Follow your health care provider's ecommendations for frequency of eye exams.  . Eat a healthy diet. Foods like vegetables, fruits, whole grains, low-fat dairy products, and lean protein foods contain the nutrients you need without too many calories. Decrease your intake of foods high in solid fats, added sugars, and salt. Eat the right amount of calories for you. Get information about a proper diet from your health care provider, if necessary.  . Regular physical exercise is one of the most important things you can  do for your health. Most adults should get at least 150 minutes of moderate-intensity exercise (any activity that increases your heart rate and causes you to sweat) each week. In addition, most adults need muscle-strengthening exercises on 2 or more days a week.  Silver Sneakers may be a benefit available to you. To determine eligibility, you may visit the website: www.silversneakers.com or contact program at [phone]) [phone] Mon-Fri between 8AM-8PM.   . Maintain a healthy weight. The body mass index (BMI) is a screening tool to identify possible weight problems. It provides an estimate of body fat based on height and weight. Your health care provider can find your BMI and can help you achieve or maintain a healthy weight.   For adults 20 years and older: ? A BMI below 18.5 is considered underweight. ? A BMI of 18.5 to 24.9 is normal. ? A BMI of 25 to 29.9 is considered overweight. ? A BMI of 30 and above is considered obese.   . Maintain normal blood lipids and cholesterol levels by exercising and minimizing your intake of saturated fat. Eat a balanced diet with plenty of fruit and vegetables. Blood tests for lipids and cholesterol should begin at age 26 and be repeated every 5 years. If your lipid or cholesterol levels are high, you are over 50, or you are at high risk for heart disease, you may need your cholesterol levels checked  more frequently. Ongoing high lipid and cholesterol levels should be treated with medicines if diet and exercise are not working.  . If you smoke, find out from your health care provider how to quit. If you do not use tobacco, please do not start.  . If you choose to drink alcohol, please do not consume more than 2 drinks per day. One drink is considered to be 12 ounces (355 mL) of beer, 5 ounces (148 mL) of wine, or 1.5 ounces (44 mL) of liquor.  . If you are 73-53 years old, ask your health care provider if you should take aspirin to prevent strokes.  . Use  sunscreen. Apply sunscreen liberally and repeatedly throughout the day. You should seek shade when your shadow is shorter than you. Protect yourself by wearing long sleeves, pants, a wide-brimmed hat, and sunglasses year round, whenever you are outdoors.  . Once a month, do a whole body skin exam, using a mirror to look at the skin on your back. Tell your health care provider of new moles, moles that have irregular borders, moles that are larger than a pencil eraser, or moles that have changed in shape or color.

## 2016-09-15 NOTE — Progress Notes (Signed)
Subjective:   Heidi Sanchez is a 75 y.o. female who presents for Medicare Annual (Subsequent) preventive examination.  Review of Systems:  N/A Cardiac Risk Factors include: advanced age (>60men, >63 women);dyslipidemia;hypertension     Objective:     Vitals: BP 140/80 (BP Location: Left Arm, Patient Position: Sitting, Cuff Size: Normal) Comment: no medications taken  Pulse 78   Temp 98.5 F (36.9 C)   Ht 5\' 3"  (1.6 m) Comment: no shoes  Wt 158 lb (71.7 kg)   SpO2 98%   BMI 27.99 kg/m   Body mass index is 27.99 kg/m.   Tobacco History  Smoking Status  . Never Smoker  Smokeless Tobacco  . Not on file     Counseling given: No   Past Medical History:  Diagnosis Date  . Cancer [facility])    breast  . Colonic polyp   . Hyperlipidemia   . Hypertension    Past Surgical History:  Procedure Laterality Date  . BREAST LUMPECTOMY     Family History  Problem Relation Age of Onset  . Cancer Mother   . Cancer Father    History  Sexual Activity  . Sexual activity: Not on file    Outpatient Encounter Prescriptions as of 09/15/2016  Medication Sig  . amLODipine (NORVASC) 5 MG tablet TAKE 1 TABLET (5 MG TOTAL) BY MOUTH DAILY.  . Calcium-Magnesium-Vitamin D (CALCIUM 1200+D3 PO) Take 3 capsules by mouth daily.  . Cholecalciferol (VITAMIN D PO) Take 500 Units by mouth daily.  . diphenhydrAMINE (BENADRYL) 25 MG tablet Take 25 mg by mouth as needed.  . fluticasone (FLONASE) 50 MCG/ACT nasal spray Place 2 sprays into both nostrils daily.  . Multiple Vitamins-Minerals (OCUVITE PRESERVISION) TABS 1 tablet 2 (two) times daily. Use as directed  . Omega-3 Fatty Acids (FISH OIL) 1000 MG CAPS Take 1 capsule (1,000 mg total) by mouth daily.  . simvastatin (ZOCOR) 40 MG tablet Take 1 tablet (40 mg total) by mouth at bedtime. OFFICE VISIT WITH LABS REQUIRED FOR ADDITIONAL REFILLS  . [DISCONTINUED] Calcium 1200-1000 MG-UNIT CHEW Chew 1 capsule by mouth daily.  . [DISCONTINUED]  Cholecalciferol (VITAMIN D3) 1000 UNITS CAPS Take 1 capsule by mouth daily.   No facility-administered encounter medications on file as of 09/15/2016.     Activities of Daily Living In your present state of health, do you have any difficulty performing the following activities: 09/15/2016  Hearing? N  Vision? Y  Difficulty concentrating or making decisions? N  Walking or climbing stairs? N  Dressing or bathing? N  Doing errands, shopping? Y  Preparing Food and eating ? N  Using the Toilet? N  In the past six months, have you accidently leaked urine? N  Do you have problems with loss of bowel control? N  Managing your Medications? N  Managing your Finances? N  Housekeeping or managing your Housekeeping? N  Some recent data might be hidden    Patient Care Team: Lucille Passy, MD as PCP - General Oneta Rack, MD as Consulting Physician (Dermatology) Leandrew Koyanagi, MD as Referring Physician (Ophthalmology) Vernon Prey, MD as Referring Physician (Ophthalmology) Boykin Nearing, DDS as Consulting Physician (Dentistry)    Assessment:     Hearing Screening   125Hz  250Hz  500Hz  1000Hz  2000Hz  3000Hz  4000Hz  6000Hz  8000Hz   Right ear:   40 0 0  40    Left ear:   0 0 40  0    Vision Screening Comments: Last vision exam in Sept 2017  with Dr. Jacklynn Ganong   Exercise Activities and Dietary recommendations Current Exercise Habits: Home exercise routine, Type of exercise: treadmill;strength training/weights, Time (Minutes): 60, Frequency (Times/Week): 4, Weekly Exercise (Minutes/Week): 240, Intensity: Moderate  Goals    . Increase physical activity          Starting 09/19/16, I will continue to exercise at least 60 min 4 days per week.       Fall Risk Fall Risk  09/15/2016 09/14/2015 05/26/2014 05/16/2013  Falls in the past year? No No Yes No  Number falls in past yr: - - 1 -  Injury with Fall? - - No -   Depression Screen PHQ 2/9 Scores 09/15/2016 09/14/2015 05/26/2014  05/16/2013  PHQ - 2 Score 0 0 0 0     Cognitive Function MMSE - Mini Mental State Exam 09/15/2016  Orientation to time 5  Orientation to Place 5  Registration 3  Attention/ Calculation 0  Recall 3  Language- name 2 objects 0  Language- repeat 1  Language- follow 3 step command 3  Language- read & follow direction 0  Write a sentence 0  Copy design 0  Total score 20     PLEASE NOTE: A Mini-Cog screen was completed. Maximum score is 20. A value of 0 denotes this part of Folstein MMSE was not completed or the patient failed this part of the Mini-Cog screening.   Mini-Cog Screening Orientation to Time - Max 5 pts Orientation to Place - Max 5 pts Registration - Max 3 pts Recall - Max 3 pts Language Repeat - Max 1 pts Language Follow 3 Step Command - Max 3 pts     Immunization History  Administered Date(s) Administered  . Influenza-Unspecified 07/11/2016  . Pneumococcal Conjugate-13 05/26/2014  . Pneumococcal Polysaccharide-23 09/15/2016  . Td 02/11/2009  . Zoster 02/15/2010   Screening Tests Health Maintenance  Topic Date Due  . TETANUS/TDAP  02/12/2019  . INFLUENZA VACCINE  Addressed  . DEXA SCAN  Completed  . ZOSTAVAX  Completed  . PNA vac Low Risk Adult  Completed      Plan:     I have personally reviewed and addressed the Medicare Annual Wellness questionnaire and have noted the following in the patient's chart:  A. Medical and social history B. Use of alcohol, tobacco or illicit drugs  C. Current medications and supplements D. Functional ability and status E.  Nutritional status F.  Physical activity G. Advance directives H. List of other physicians I.  Hospitalizations, surgeries, and ER visits in previous 12 months J.  [place] to include hearing, vision, cognitive, depression L. Referrals and appointments - none  In addition, I have reviewed and discussed with patient certain preventive protocols, quality metrics, and best practice  recommendations. A written personalized care plan for preventive services as well as general preventive health recommendations were provided to patient.  See attached scanned questionnaire for additional information.   Signed,   Lindell Noe, MHA, BS, LPN Health Coach

## 2016-09-16 LAB — CBC WITH DIFFERENTIAL/PLATELET
BASOS PCT: 0.4 % (ref 0.0–3.0)
Basophils Absolute: 0 10*3/uL (ref 0.0–0.1)
EOS ABS: 0.1 10*3/uL (ref 0.0–0.7)
Eosinophils Relative: 1.1 % (ref 0.0–5.0)
HCT: 43.1 % (ref 36.0–46.0)
HEMOGLOBIN: 14.7 g/dL (ref 12.0–15.0)
LYMPHS ABS: 2.1 10*3/uL (ref 0.7–4.0)
Lymphocytes Relative: 31.1 % (ref 12.0–46.0)
MCHC: 34.1 g/dL (ref 30.0–36.0)
MCV: 95 fl (ref 78.0–100.0)
MONO ABS: 0.7 10*3/uL (ref 0.1–1.0)
Monocytes Relative: 10.5 % (ref 3.0–12.0)
NEUTROS PCT: 56.9 % (ref 43.0–77.0)
Neutro Abs: 3.8 10*3/uL (ref 1.4–7.7)
PLATELETS: 221 10*3/uL (ref 150.0–400.0)
RBC: 4.54 Mil/uL (ref 3.87–5.11)
RDW: 12.9 % (ref 11.5–15.5)
WBC: 6.6 10*3/uL (ref 4.0–10.5)

## 2016-09-19 ENCOUNTER — Ambulatory Visit (INDEPENDENT_AMBULATORY_CARE_PROVIDER_SITE_OTHER): Payer: Medicare Other | Admitting: Family Medicine

## 2016-09-19 ENCOUNTER — Encounter: Payer: Self-pay | Admitting: Family Medicine

## 2016-09-19 VITALS — BP 116/70 | HR 86 | Temp 98.0°F | Ht 63.0 in | Wt 160.0 lb

## 2016-09-19 DIAGNOSIS — Z853 Personal history of malignant neoplasm of breast: Secondary | ICD-10-CM

## 2016-09-19 DIAGNOSIS — E559 Vitamin D deficiency, unspecified: Secondary | ICD-10-CM

## 2016-09-19 DIAGNOSIS — Z0000 Encounter for general adult medical examination without abnormal findings: Secondary | ICD-10-CM

## 2016-09-19 DIAGNOSIS — I10 Essential (primary) hypertension: Secondary | ICD-10-CM | POA: Diagnosis not present

## 2016-09-19 DIAGNOSIS — Z1239 Encounter for other screening for malignant neoplasm of breast: Secondary | ICD-10-CM | POA: Diagnosis not present

## 2016-09-19 DIAGNOSIS — E7800 Pure hypercholesterolemia, unspecified: Secondary | ICD-10-CM

## 2016-09-19 DIAGNOSIS — Z01419 Encounter for gynecological examination (general) (routine) without abnormal findings: Secondary | ICD-10-CM | POA: Diagnosis not present

## 2016-09-19 NOTE — Patient Instructions (Signed)
Great to see you. Happy holidays, Happy New Year and Happy birthday!

## 2016-09-19 NOTE — Assessment & Plan Note (Signed)
Well controlled. No changes made today. 

## 2016-09-19 NOTE — Assessment & Plan Note (Signed)
Well controlled on current dose of zocor. No changes made to rxs today. 

## 2016-09-19 NOTE — Assessment & Plan Note (Signed)
Mammogram up to date.  

## 2016-09-19 NOTE — Assessment & Plan Note (Signed)
Reviewed preventive care protocols, scheduled due services, and updated immunizations Discussed nutrition, exercise, diet, and healthy lifestyle.  

## 2016-09-19 NOTE — Progress Notes (Signed)
Subjective:   Patient ID: Heidi Sanchez, female    DOB: 1940/12/06, 75 y.o.   MRN: [id number]  Heidi Sanchez is a pleasant 75 y.o. year old female who presents to clinic today with Annual Exam  and follow up of chronic medical conditions on 09/19/2016  HPI:  Annual medicare wellness visit with Candis Musa, RN on 09/15/16. Note reviewed.  Colonoscopy 10/25/13 ([facility])- 5 year recall Prevnar 13 05/26/14 Pneumovax 09/15/16 Td 02/11/09 Zoster 02/15/10 DEXA- 10/15/15 Eye exam 04/2016 Influenza vaccine 07/11/16 Mammogram 04/08/16  LMP over 25 years ago. No post menopausl bleeding  Vit D deficiency- improved, Vit D now normal at 31/50  HLD- controlled on current dose of zocor.  Denies myalgias. Lab Results  Component Value Date   CHOL 161 09/15/2016   HDL 35.30 (L) 09/15/2016   LDLCALC 104 (H) 09/15/2016   TRIG 112.0 09/15/2016   CHOLHDL 5 09/15/2016   Lab Results  Component Value Date   ALT 30 09/15/2016   AST 26 09/15/2016   ALKPHOS 48 09/15/2016   BILITOT 1.0 09/15/2016   HTN- well controlled on current dose of norvasc. Denies HA, blurred vision, CP or SOB.  NO LE edema. Lab Results  Component Value Date   CREATININE 0.85 09/15/2016   Current Outpatient Prescriptions on File Prior to Visit  Medication Sig Dispense Refill  . amLODipine (NORVASC) 5 MG tablet TAKE 1 TABLET (5 MG TOTAL) BY MOUTH DAILY. 90 tablet 1  . Calcium-Magnesium-Vitamin D (CALCIUM 1200+D3 PO) Take 3 capsules by mouth daily.    . Cholecalciferol (VITAMIN D PO) Take 500 Units by mouth daily.    . diphenhydrAMINE (BENADRYL) 25 MG tablet Take 25 mg by mouth as needed.    . fluticasone (FLONASE) 50 MCG/ACT nasal spray Place 2 sprays into both nostrils daily.    . Multiple Vitamins-Minerals (OCUVITE PRESERVISION) TABS 1 tablet 2 (two) times daily. Use as directed    . Omega-3 Fatty Acids (FISH OIL) 1000 MG CAPS Take 1 capsule (1,000 mg total) by mouth daily. 90 capsule 3  . simvastatin (ZOCOR) 40 MG  tablet Take 1 tablet (40 mg total) by mouth at bedtime. OFFICE VISIT WITH LABS REQUIRED FOR ADDITIONAL REFILLS 90 tablet 0   No current facility-administered medications on file prior to visit.     Allergies  Allergen Reactions  . Codeine     REACTION: Nigthmares    Past Medical History:  Diagnosis Date  . Cancer [facility])    breast  . Colonic polyp   . Hyperlipidemia   . Hypertension     Past Surgical History:  Procedure Laterality Date  . BREAST LUMPECTOMY      Family History  Problem Relation Age of Onset  . Cancer Mother   . Cancer Father     Social History   Social History  . Marital status: Married    Spouse name: N/A  . Number of children: N/A  . Years of education: N/A   Occupational History  . Not on file.   Social History Main Topics  . Smoking status: Never Smoker  . Smokeless tobacco: Not on file  . Alcohol use No  . Drug use: Unknown  . Sexual activity: Not on file   Other Topics Concern  . Not on file   Social History Narrative   HPOA is her husband, Anell Meegan.     She would want CPR- no feeding tube or "heroic measures."   The PMH, PSH, Social History, Family History,  Medications, and allergies have been reviewed in [facility], and have been updated if relevant.   Review of Systems  Constitutional: Negative.   HENT: Negative.   Eyes: Negative.   Respiratory: Negative.   Cardiovascular: Negative.   Gastrointestinal: Negative.   Endocrine: Negative.   Genitourinary: Negative.   Musculoskeletal: Negative.   Skin: Negative.   Allergic/Immunologic: Negative.   Neurological: Negative.   Hematological: Negative.   Psychiatric/Behavioral: Negative.   All other systems reviewed and are negative.      Objective:    BP 116/70   Pulse 86   Temp 98 F (36.7 C) (Oral)   Ht 5\' 3"  (1.6 m)   Wt 160 lb (72.6 kg)   SpO2 99%   BMI 28.34 kg/m    Physical Exam  Constitutional: She is oriented to person, place, and time. She appears  well-developed and well-nourished. No distress.  HENT:  Head: Normocephalic and atraumatic.  Eyes: Conjunctivae are normal.  Neck: Normal range of motion.  Cardiovascular: Normal rate and regular rhythm.   Pulmonary/Chest: Effort normal and breath sounds normal. Right breast exhibits no inverted nipple, no mass, no nipple discharge, no skin change and no tenderness. Left breast exhibits no inverted nipple, no mass, no nipple discharge, no skin change and no tenderness.  Abdominal: Soft. Bowel sounds are normal. She exhibits no distension.  Genitourinary: Rectum normal, vagina normal and uterus normal. No breast swelling, tenderness, discharge or bleeding. There is no rash or tenderness on the right labia. There is no rash or tenderness on the left labia. Right adnexum displays no mass and no tenderness. Left adnexum displays no mass and no tenderness.  Musculoskeletal: Normal range of motion.  Neurological: She is alert and oriented to person, place, and time. No cranial nerve deficit.  Skin: Skin is warm and dry. She is not diaphoretic.  Psychiatric: She has a normal mood and affect. Her behavior is normal. Judgment and thought content normal.  Nursing note and vitals reviewed.         Assessment & Plan:   Routine general medical examination at a health care facility  Vitamin D deficiency  Essential hypertension  BREAST CANCER, HX OF No Follow-up on file.

## 2016-09-19 NOTE — Progress Notes (Signed)
Pre visit review using our clinic review tool, if applicable. No additional management support is needed unless otherwise documented below in the visit note. 

## 2016-10-28 ENCOUNTER — Telehealth: Payer: Self-pay | Admitting: Family Medicine

## 2016-10-28 NOTE — Telephone Encounter (Signed)
Pt came in today wanting to talk to someone about her bill 10/20/16.  Her medicare wellness 12/14 with leisa and 12/18 with dr Cloyde Reams was for 107.  Please call pt to discuss this.  The 09/19/16 bill was not included on this bill

## 2016-11-03 NOTE — Telephone Encounter (Signed)
Left mssg for Heidi Sanchez to return my call.

## 2016-11-17 DIAGNOSIS — L821 Other seborrheic keratosis: Secondary | ICD-10-CM | POA: Diagnosis not present

## 2016-11-17 DIAGNOSIS — Z08 Encounter for follow-up examination after completed treatment for malignant neoplasm: Secondary | ICD-10-CM | POA: Diagnosis not present

## 2016-11-17 DIAGNOSIS — Z85828 Personal history of other malignant neoplasm of skin: Secondary | ICD-10-CM | POA: Diagnosis not present

## 2016-11-29 ENCOUNTER — Other Ambulatory Visit: Payer: Self-pay | Admitting: Family Medicine

## 2016-12-08 ENCOUNTER — Other Ambulatory Visit: Payer: Self-pay | Admitting: Family Medicine

## 2016-12-15 DIAGNOSIS — H40003 Preglaucoma, unspecified, bilateral: Secondary | ICD-10-CM | POA: Diagnosis not present

## 2017-01-17 DIAGNOSIS — H43813 Vitreous degeneration, bilateral: Secondary | ICD-10-CM | POA: Diagnosis not present

## 2017-03-07 ENCOUNTER — Other Ambulatory Visit: Payer: Self-pay | Admitting: Family Medicine

## 2017-03-07 DIAGNOSIS — Z1231 Encounter for screening mammogram for malignant neoplasm of breast: Secondary | ICD-10-CM

## 2017-04-19 ENCOUNTER — Ambulatory Visit
Admission: RE | Admit: 2017-04-19 | Discharge: 2017-04-19 | Disposition: A | Discharge status: Home / Self Care | Payer: Medicare Other | Source: Ambulatory Visit | Attending: Family Medicine | Admitting: Family Medicine

## 2017-04-19 DIAGNOSIS — Z1231 Encounter for screening mammogram for malignant neoplasm of breast: Secondary | ICD-10-CM

## 2017-05-14 ENCOUNTER — Other Ambulatory Visit: Payer: Self-pay | Admitting: Family Medicine

## 2017-06-12 DIAGNOSIS — H43813 Vitreous degeneration, bilateral: Secondary | ICD-10-CM | POA: Diagnosis not present

## 2017-06-15 DIAGNOSIS — H40003 Preglaucoma, unspecified, bilateral: Secondary | ICD-10-CM | POA: Diagnosis not present

## 2017-06-22 DIAGNOSIS — H40003 Preglaucoma, unspecified, bilateral: Secondary | ICD-10-CM | POA: Diagnosis not present

## 2017-07-10 DIAGNOSIS — Z23 Encounter for immunization: Secondary | ICD-10-CM | POA: Diagnosis not present

## 2017-08-01 ENCOUNTER — Telehealth: Payer: Self-pay | Admitting: Family Medicine

## 2017-08-01 NOTE — Telephone Encounter (Signed)
Spoke with spouse/appt sched for 11.05.2018 at 12pm/thx dmf

## 2017-08-01 NOTE — Telephone Encounter (Signed)
New Message  Pt c/o BP issue: STAT if pt c/o blurred vision, one-sided weakness or slurred speech  1. What are your last 5 BP readings?  4.26.18-(136/78)   6.25.18-(141.86)   06.26.18-(144/86)   10.26.18-(138.84), (163/90), (154/90), (149/86)  10.27.18-(159/90)  10.30.18-(158/86)  2. Are you having any other symptoms (ex. Dizziness, headache, blurred vision, passed out)? No  3. What is your BP issue? Pts husband is a little concerned about his wife's bp numbers and would like for someone to reach out to him.  Pts husband walked in and left a copy of the bp readings.

## 2017-08-07 ENCOUNTER — Encounter: Payer: Self-pay | Admitting: Family Medicine

## 2017-08-07 ENCOUNTER — Ambulatory Visit (INDEPENDENT_AMBULATORY_CARE_PROVIDER_SITE_OTHER): Payer: Medicare Other | Admitting: Family Medicine

## 2017-08-07 VITALS — BP 136/80 | HR 81 | Temp 98.5°F | Ht 63.0 in | Wt 162.8 lb

## 2017-08-07 DIAGNOSIS — I10 Essential (primary) hypertension: Secondary | ICD-10-CM | POA: Diagnosis not present

## 2017-08-07 NOTE — Assessment & Plan Note (Signed)
Normotensive here today and has been essentially normotensive at home.  Reassurance provided. No changes made to rxs. Continue to check BP daily. Call or return to clinic prn if these symptoms worsen or fail to improve as anticipated. The patient indicates understanding of these issues and agrees with the plan.

## 2017-08-07 NOTE — Progress Notes (Signed)
Subjective:   Patient ID: Heidi Sanchez, female    DOB: 1941/01/16, 76 y.o.   MRN: [id number]  Heidi Sanchez is a pleasant 76 y.o. year old female who presents to clinic today with Hypertension (Patient is here today for elevated BP readings.  )  on 08/07/2017  HPI:  HTN- has been well controlled on Norvasc 5 mg daily.  She is under more stress lately- son has been having health issues. Checking BP daily at home- brings in log today- overall running in 120s-130s/70s- 80s.  Has had a few isolated readings in 150s- 160s/80s-90s.  Asymptomatic during those times. BP Readings from Last 3 Encounters:  08/07/17 136/80  09/19/16 116/70  09/15/16 140/80     Current Outpatient Medications on File Prior to Visit  Medication Sig Dispense Refill  . amLODipine (NORVASC) 5 MG tablet TAKE 1 TABLET (5 MG TOTAL) BY MOUTH DAILY. 90 tablet 1  . Calcium-Magnesium-Vitamin D (CALCIUM 1200+D3 PO) Take 2 capsules daily by mouth.     . diphenhydrAMINE (BENADRYL) 25 MG tablet Take 25 mg by mouth as needed.    . fluticasone (FLONASE) 50 MCG/ACT nasal spray Place 2 sprays into both nostrils daily.    . Multiple Vitamins-Minerals (OCUVITE PRESERVISION) TABS 1 tablet 2 (two) times daily. Use as directed    . Omega-3 Fatty Acids (FISH OIL) 1000 MG CAPS Take 1 capsule (1,000 mg total) by mouth daily. 90 capsule 3  . simvastatin (ZOCOR) 40 MG tablet TAKE 1 TABLET (40 MG TOTAL) BY MOUTH AT BEDTIME. OFFICE VISIT WITH LABS REQUIRED FOR ADD REFILLS 90 tablet 2   No current facility-administered medications on file prior to visit.     Allergies  Allergen Reactions  . Codeine     REACTION: Nigthmares    Past Medical History:  Diagnosis Date  . Cancer [name])    breast  . Colonic polyp   . Hyperlipidemia   . Hypertension     Past Surgical History:  Procedure Laterality Date  . BREAST LUMPECTOMY      Family History  Problem Relation Age of Onset  . Cancer Mother   . Cancer Father     Social History     Socioeconomic History  . Marital status: Married    Spouse name: Not on file  . Number of children: Not on file  . Years of education: Not on file  . Highest education level: Not on file  Social Needs  . Financial resource strain: Not on file  . Food insecurity - worry: Not on file  . Food insecurity - inability: Not on file  . Transportation needs - medical: Not on file  . Transportation needs - non-medical: Not on file  Occupational History  . Not on file  Tobacco Use  . Smoking status: Never Smoker  . Smokeless tobacco: Never Used  Substance and Sexual Activity  . Alcohol use: No  . Drug use: No  . Sexual activity: Not on file  Other Topics Concern  . Not on file  Social History Narrative   HPOA is her husband, [name].     She would want CPR- no feeding tube or "heroic measures."   The PMH, PSH, Social History, Family History, Medications, and allergies have been reviewed in [facility], and have been updated if relevant.  Review of Systems  Constitutional: Negative.   Eyes: Negative.   Respiratory: Negative.   Cardiovascular: Negative.   Gastrointestinal: Negative.   Musculoskeletal: Negative.   Neurological: Negative.  Hematological: Negative.   Psychiatric/Behavioral: Negative.   All other systems reviewed and are negative.      Objective:    BP 136/80 (BP Location: Right Arm, Patient Position: Sitting, Cuff Size: Normal)   Pulse 81   Temp 98.5 F (36.9 C) (Oral)   Ht 5\' 3"  (1.6 m)   Wt 162 lb 12.8 oz (73.8 kg)   SpO2 97%   BMI 28.84 kg/m    Physical Exam  Constitutional: She is oriented to person, place, and time. She appears well-developed and well-nourished. No distress.  HENT:  Head: Normocephalic and atraumatic.  Eyes: Conjunctivae are normal.  Cardiovascular: Normal rate and regular rhythm.  Pulmonary/Chest: Effort normal and breath sounds normal. She exhibits no tenderness.  Musculoskeletal: Normal range of motion. She exhibits no  edema.  Neurological: She is alert and oriented to person, place, and time. No cranial nerve deficit.  Skin: Skin is warm and dry. She is not diaphoretic.  Psychiatric: She has a normal mood and affect. Her behavior is normal. Judgment and thought content normal.  Nursing note and vitals reviewed.         Assessment & Plan:   Screening for cervical cancer - Plan: Cytology - PAP  Need for Tdap vaccination  Essential hypertension No Follow-up on file.

## 2017-11-09 ENCOUNTER — Other Ambulatory Visit: Payer: Self-pay | Admitting: Family Medicine

## 2017-11-16 DIAGNOSIS — L821 Other seborrheic keratosis: Secondary | ICD-10-CM | POA: Diagnosis not present

## 2017-11-16 DIAGNOSIS — D2272 Melanocytic nevi of left lower limb, including hip: Secondary | ICD-10-CM | POA: Diagnosis not present

## 2017-11-16 DIAGNOSIS — Z85828 Personal history of other malignant neoplasm of skin: Secondary | ICD-10-CM | POA: Diagnosis not present

## 2017-11-16 DIAGNOSIS — D225 Melanocytic nevi of trunk: Secondary | ICD-10-CM | POA: Diagnosis not present

## 2017-11-24 ENCOUNTER — Telehealth: Payer: Self-pay

## 2017-11-24 DIAGNOSIS — E7800 Pure hypercholesterolemia, unspecified: Secondary | ICD-10-CM

## 2017-11-24 DIAGNOSIS — I10 Essential (primary) hypertension: Secondary | ICD-10-CM

## 2017-11-24 DIAGNOSIS — E559 Vitamin D deficiency, unspecified: Secondary | ICD-10-CM

## 2017-11-24 DIAGNOSIS — Z853 Personal history of malignant neoplasm of breast: Secondary | ICD-10-CM

## 2017-11-24 NOTE — Telephone Encounter (Signed)
TA-Pt is scheduled for lab visit on Monday at 1:15pm for visit with you on Tuesday/What would you like to order? CMP; CBC; TSH; Lipid; Vit-Rossetta Kama? Plz advise/thx dmf

## 2017-11-24 NOTE — Telephone Encounter (Signed)
I have placed lab orders.  Thank you 1

## 2017-11-27 ENCOUNTER — Other Ambulatory Visit (INDEPENDENT_AMBULATORY_CARE_PROVIDER_SITE_OTHER): Payer: Medicare Other

## 2017-11-27 DIAGNOSIS — E559 Vitamin D deficiency, unspecified: Secondary | ICD-10-CM | POA: Diagnosis not present

## 2017-11-27 DIAGNOSIS — Z853 Personal history of malignant neoplasm of breast: Secondary | ICD-10-CM | POA: Diagnosis not present

## 2017-11-27 DIAGNOSIS — E7800 Pure hypercholesterolemia, unspecified: Secondary | ICD-10-CM | POA: Diagnosis not present

## 2017-11-27 LAB — CBC WITH DIFFERENTIAL/PLATELET
BASOS ABS: 0.1 10*3/uL (ref 0.0–0.1)
Basophils Relative: 1 % (ref 0.0–3.0)
EOS ABS: 0.1 10*3/uL (ref 0.0–0.7)
Eosinophils Relative: 1 % (ref 0.0–5.0)
HEMATOCRIT: 43 % (ref 36.0–46.0)
Hemoglobin: 14.7 g/dL (ref 12.0–15.0)
LYMPHS PCT: 33.5 % (ref 12.0–46.0)
Lymphs Abs: 2.1 10*3/uL (ref 0.7–4.0)
MCHC: 34.3 g/dL (ref 30.0–36.0)
MCV: 97 fl (ref 78.0–100.0)
MONO ABS: 0.7 10*3/uL (ref 0.1–1.0)
Monocytes Relative: 11.4 % (ref 3.0–12.0)
Neutro Abs: 3.3 10*3/uL (ref 1.4–7.7)
Neutrophils Relative %: 53.1 % (ref 43.0–77.0)
PLATELETS: 234 10*3/uL (ref 150.0–400.0)
RBC: 4.44 Mil/uL (ref 3.87–5.11)
RDW: 13.1 % (ref 11.5–15.5)
WBC: 6.3 10*3/uL (ref 4.0–10.5)

## 2017-11-27 LAB — LIPID PANEL
CHOL/HDL RATIO: 6
Cholesterol: 142 mg/dL (ref 0–200)
HDL: 25.4 mg/dL — AB (ref >39.00)
LDL Cholesterol: 81 mg/dL (ref 0–99)
NONHDL: 117
Triglycerides: 178 mg/dL — ABNORMAL HIGH (ref 0.0–149.0)
VLDL: 35.6 mg/dL (ref 0.0–40.0)

## 2017-11-27 LAB — COMPREHENSIVE METABOLIC PANEL
ALT: 20 U/L (ref 0–35)
AST: 19 U/L (ref 0–37)
Albumin: 4.3 g/dL (ref 3.5–5.2)
Alkaline Phosphatase: 44 U/L (ref 39–117)
BUN: 15 mg/dL (ref 6–23)
CO2: 28 meq/L (ref 19–32)
Calcium: 10.9 mg/dL — ABNORMAL HIGH (ref 8.4–10.5)
Chloride: 107 mEq/L (ref 96–112)
Creatinine, Ser: 0.86 mg/dL (ref 0.40–1.20)
GFR: 67.64 mL/min (ref >60.00)
Glucose, Bld: 116 mg/dL — ABNORMAL HIGH (ref 70–99)
POTASSIUM: 5.3 meq/L — AB (ref 3.5–5.1)
SODIUM: 142 meq/L (ref 135–145)
TOTAL PROTEIN: 7.6 g/dL (ref 6.0–8.3)
Total Bilirubin: 0.7 mg/dL (ref 0.2–1.2)

## 2017-11-27 LAB — TSH: TSH: 1.62 u[IU]/mL (ref 0.35–4.50)

## 2017-11-27 LAB — VITAMIN D 25 HYDROXY (VIT D DEFICIENCY, FRACTURES): VITD: 30.38 ng/mL (ref 30.00–100.00)

## 2017-11-28 ENCOUNTER — Encounter: Payer: Self-pay | Admitting: Family Medicine

## 2017-11-28 ENCOUNTER — Ambulatory Visit (INDEPENDENT_AMBULATORY_CARE_PROVIDER_SITE_OTHER): Payer: Medicare Other | Admitting: Family Medicine

## 2017-11-28 VITALS — BP 138/86 | HR 74 | Temp 98.8°F | Ht 63.0 in | Wt 159.6 lb

## 2017-11-28 DIAGNOSIS — I10 Essential (primary) hypertension: Secondary | ICD-10-CM

## 2017-11-28 DIAGNOSIS — E7800 Pure hypercholesterolemia, unspecified: Secondary | ICD-10-CM

## 2017-11-28 MED ORDER — AMLODIPINE BESYLATE 5 MG PO TABS: 5.0000 mg | ORAL_TABLET | Freq: Every day | ORAL | 2 refills | Status: DC

## 2017-11-28 MED ORDER — SIMVASTATIN 40 MG PO TABS: ORAL_TABLET | ORAL | 2 refills | Status: DC

## 2017-11-28 NOTE — Assessment & Plan Note (Signed)
Well controlled on current rx. No changes made today. 

## 2017-11-28 NOTE — Patient Instructions (Addendum)
Great to see you. Let's decrease your calcium to one capsule daily.

## 2017-11-28 NOTE — Progress Notes (Signed)
Subjective:   Patient ID: Heidi Sanchez, female    DOB: 1941-03-24, 77 y.o.   MRN: [id number]  Heidi Sanchez is a pleasant 77 y.o. year old female who presents to clinic today with Hypertension (Patient is here today to F/U with HTN.   Fasting labs completed on 2.25.19.)  on 11/28/2017  HPI:  HTN- has been well controlled on Norvasc 5 mg daily.  She is under more stress lately- son has been having health issues. Checking BP daily at home- brings in log today- overall running in 120s-130s/70s- 80s.  Has had a few isolated readings in 150s- 160s/80s-90s.  Asymptomatic during those times.  HLD- has been well controlled on current dose of zocor.  Lab Results  Component Value Date   CHOL 142 11/27/2017   HDL 25.40 (L) 11/27/2017   LDLCALC 81 11/27/2017   TRIG 178.0 (H) 11/27/2017   CHOLHDL 6 11/27/2017    Current Outpatient Medications on File Prior to Visit  Medication Sig Dispense Refill  . Calcium-Magnesium-Vitamin D (CALCIUM 1200+D3 PO) Take 1 capsule by mouth daily.    . diphenhydrAMINE (BENADRYL) 25 MG tablet Take 25 mg by mouth as needed.    . fluticasone (FLONASE) 50 MCG/ACT nasal spray Place 2 sprays into both nostrils daily.    . Multiple Vitamins-Minerals (OCUVITE PRESERVISION) TABS 1 tablet 2 (two) times daily. Use as directed    . Omega-3 Fatty Acids (FISH OIL) 1000 MG CAPS Take 1 capsule (1,000 mg total) by mouth daily. 90 capsule 3   No current facility-administered medications on file prior to visit.     Allergies  Allergen Reactions  . Codeine     REACTION: Nigthmares    Past Medical History:  Diagnosis Date  . Cancer [facility])    breast  . Colonic polyp   . Hyperlipidemia   . Hypertension     Past Surgical History:  Procedure Laterality Date  . BREAST LUMPECTOMY      Family History  Problem Relation Age of Onset  . Cancer Mother   . Cancer Father     Social History   Socioeconomic History  . Marital status: Married    Spouse name: Not on file   . Number of children: Not on file  . Years of education: Not on file  . Highest education level: Not on file  Social Needs  . Financial resource strain: Not on file  . Food insecurity - worry: Not on file  . Food insecurity - inability: Not on file  . Transportation needs - medical: Not on file  . Transportation needs - non-medical: Not on file  Occupational History  . Not on file  Tobacco Use  . Smoking status: Never Smoker  . Smokeless tobacco: Never Used  Substance and Sexual Activity  . Alcohol use: No  . Drug use: No  . Sexual activity: Not on file  Other Topics Concern  . Not on file  Social History Narrative   HPOA is her husband, Heidi Sanchez.     She would want CPR- no feeding tube or "heroic measures."   The PMH, PSH, Social History, Family History, Medications, and allergies have been reviewed in [facility], and have been updated if relevant.    Review of Systems  Constitutional: Negative.   HENT: Negative.   Eyes: Negative.   Respiratory: Negative.   Cardiovascular: Negative.   Gastrointestinal: Negative.   Endocrine: Negative.   Genitourinary: Negative.   Musculoskeletal: Negative.   Skin: Negative.  Neurological: Negative.   Hematological: Negative.   Psychiatric/Behavioral: Negative.   All other systems reviewed and are negative.      Objective:    BP 138/86 (BP Location: Left Arm, Patient Position: Sitting, Cuff Size: Normal)   Pulse 74   Temp 98.8 F (37.1 C) (Oral)   Ht 5\' 3"  (1.6 m)   Wt 159 lb 9.6 oz (72.4 kg)   SpO2 97%   BMI 28.27 kg/m    Physical Exam  Constitutional: She is oriented to person, place, and time. She appears well-developed and well-nourished. No distress.  HENT:  Head: Normocephalic and atraumatic.  Eyes: Conjunctivae are normal.  Cardiovascular: Normal rate and regular rhythm.  Pulmonary/Chest: Effort normal and breath sounds normal.  Musculoskeletal: Normal range of motion. She exhibits no edema.  Neurological:  She is alert and oriented to person, place, and time. No cranial nerve deficit.  Skin: Skin is warm and dry. She is not diaphoretic.  Psychiatric: She has a normal mood and affect. Her behavior is normal. Judgment and thought content normal.  Nursing note and vitals reviewed.         Assessment & Plan:   Essential hypertension  Pure hypercholesterolemia No Follow-up on file.

## 2017-12-19 DIAGNOSIS — H40003 Preglaucoma, unspecified, bilateral: Secondary | ICD-10-CM | POA: Diagnosis not present

## 2018-05-07 ENCOUNTER — Other Ambulatory Visit: Payer: Self-pay | Admitting: Family Medicine

## 2018-05-07 DIAGNOSIS — Z1231 Encounter for screening mammogram for malignant neoplasm of breast: Secondary | ICD-10-CM

## 2018-05-10 ENCOUNTER — Ambulatory Visit
Admission: RE | Admit: 2018-05-10 | Discharge: 2018-05-10 | Disposition: A | Discharge status: Home / Self Care | Payer: Medicare Other | Source: Ambulatory Visit | Attending: Family Medicine | Admitting: Family Medicine

## 2018-05-10 DIAGNOSIS — Z1231 Encounter for screening mammogram for malignant neoplasm of breast: Secondary | ICD-10-CM | POA: Diagnosis not present

## 2018-05-10 HISTORY — DX: Personal history of irradiation: Z92.3

## 2018-05-10 HISTORY — DX: Malignant neoplasm of unspecified site of unspecified female breast: C50.919

## 2018-06-18 DIAGNOSIS — H40003 Preglaucoma, unspecified, bilateral: Secondary | ICD-10-CM | POA: Diagnosis not present

## 2018-06-25 DIAGNOSIS — H40003 Preglaucoma, unspecified, bilateral: Secondary | ICD-10-CM | POA: Diagnosis not present

## 2018-07-04 DIAGNOSIS — Z23 Encounter for immunization: Secondary | ICD-10-CM | POA: Diagnosis not present

## 2018-09-18 ENCOUNTER — Ambulatory Visit (INDEPENDENT_AMBULATORY_CARE_PROVIDER_SITE_OTHER): Payer: Medicare Other | Admitting: Family Medicine

## 2018-09-18 ENCOUNTER — Encounter: Payer: Self-pay | Admitting: Family Medicine

## 2018-09-18 VITALS — BP 166/88 | HR 78 | Temp 98.0°F | Ht 63.0 in | Wt 156.0 lb

## 2018-09-18 DIAGNOSIS — I10 Essential (primary) hypertension: Secondary | ICD-10-CM

## 2018-09-18 DIAGNOSIS — E7800 Pure hypercholesterolemia, unspecified: Secondary | ICD-10-CM

## 2018-09-18 LAB — COMPREHENSIVE METABOLIC PANEL
ALBUMIN: 4.6 g/dL (ref 3.5–5.2)
ALT: 17 U/L (ref 0–35)
AST: 17 U/L (ref 0–37)
Alkaline Phosphatase: 57 U/L (ref 39–117)
BUN: 11 mg/dL (ref 6–23)
CALCIUM: 10.8 mg/dL — AB (ref 8.4–10.5)
CO2: 30 meq/L (ref 19–32)
CREATININE: 0.76 mg/dL (ref 0.40–1.20)
Chloride: 106 mEq/L (ref 96–112)
GFR: 77.84 mL/min (ref >60.00)
Glucose, Bld: 107 mg/dL — ABNORMAL HIGH (ref 70–99)
Potassium: 4.2 mEq/L (ref 3.5–5.1)
Sodium: 143 mEq/L (ref 135–145)
Total Bilirubin: 0.9 mg/dL (ref 0.2–1.2)
Total Protein: 7.4 g/dL (ref 6.0–8.3)

## 2018-09-18 MED ORDER — QUINAPRIL HCL 5 MG PO TABS: 5.0000 mg | ORAL_TABLET | Freq: Every day | ORAL | 3 refills | Status: DC

## 2018-09-18 MED ORDER — SIMVASTATIN 40 MG PO TABS: ORAL_TABLET | ORAL | 2 refills | Status: DC

## 2018-09-18 NOTE — Assessment & Plan Note (Signed)
>  25 minutes spent in face to face time with patient, >50% spent in counselling or coordination of care Deteriorated but understandable given recent stressors.  She and her husband feel they are handling grief okay. Will add low dose quinapril to current norvasc 5 mg daily. Mr. Bridwell will check her BP daily over the next few weeks and contact me with her readings, sooner if she doesn't feel well or has concerns. The patient indicates understanding of these issues and agrees with the plan.

## 2018-09-18 NOTE — Assessment & Plan Note (Signed)
Continue current dose of zocor. No changes made today.

## 2018-09-18 NOTE — Progress Notes (Signed)
Subjective:   Patient ID: Heidi Sanchez, female    DOB: [birth year], 77 y.o.   MRN: [id number]  Heidi Sanchez is a pleasant 77 y.o. year old female who presents to clinic today with Hypertension (Patient is here today for a F/U with HTN. She was last seen on 2.26.19 at which time her HTN was well controlled and no changes were made.  Labs showed her calcium to be slightly elevated at 10.9 so patient agreed to decrease her calcium intake from 2qd to 1qd.  She reports today that her BP has been running 140's over low 80's.  Has had a lot of stress and death over the last few months.  BLE edems which she states has been for months.)  on 09/18/2018  HPI: She is here with her husband, Juanda Crumble today. Unfortunately, they have had a difficult year.  Son died in 07-17-18 unexpectedly.  He had a lot of medical problems and they feel they are coping okay but still has been very hard.  They moved from [facility] to [place] recently to live closer to their other son.  HTN-I last saw her on 11/28/17.  Note reviewed. At that OV, BP had been well controlled on Norvasc 5 mg daily.  No changes were made to rxs at that [place].   She does feel since I last saw her that her BP has been increasing with all of the recent stressors.  She has not had any HA, blurred vision, CP or SOB.  Sometimes her cheeks do get flushed and for her that is sometimes a sign that her BP is elevated. Her husband checks her BP regularly at home typically but has been checking her BP on a regular basis  given all the recent stress.  The one time he did check it, it was higher than it typically is but not as high as it is here today.  She denies feeling depressed.  "I just miss him."  BP Readings from Last 3 Encounters:  09/18/18 (!) 166/88  11/28/17 138/86  08/07/17 136/80   Wt Readings from Last 3 Encounters:  09/18/18 156 lb (70.8 kg)  11/28/17 159 lb 9.6 oz (72.4 kg)  08/07/17 162 lb 12.8 oz (73.8 kg)    HLD- has also been well  controlled on current dose of zocor.  Lab Results  Component Value Date   CHOL 142 11/27/2017   HDL 25.40 (L) 11/27/2017   LDLCALC 81 11/27/2017   TRIG 178.0 (H) 11/27/2017   CHOLHDL 6 11/27/2017   Her calcium was elevated so I advised her to decrease her calcium supplement from 2 capsules to one capsule daily.  Lab Results  Component Value Date   CALCIUM 10.9 (H) 11/27/2017    Current Outpatient Medications on File Prior to Visit  Medication Sig Dispense Refill  . amLODipine (NORVASC) 5 MG tablet Take 1 tablet (5 mg total) by mouth daily. 90 tablet 2  . Calcium-Magnesium-Vitamin D (CALCIUM 1200+D3 PO) Take 1 capsule by mouth daily.    . diphenhydrAMINE (BENADRYL) 25 MG tablet Take 25 mg by mouth as needed.    . fluticasone (FLONASE) 50 MCG/ACT nasal spray Place 2 sprays into both nostrils daily.    . Multiple Vitamins-Minerals (OCUVITE PRESERVISION) TABS 1 tablet 2 (two) times daily. Use as directed    . Omega-3 Fatty Acids (FISH OIL) 1000 MG CAPS Take 1 capsule (1,000 mg total) by mouth daily. 90 capsule 3   No current facility-administered medications on file  prior to visit.     Allergies  Allergen Reactions  . Codeine     REACTION: Nigthmares    Past Medical History:  Diagnosis Date  . Breast cancer ([place])   . Cancer [facility])    breast  . Colonic polyp   . Hyperlipidemia   . Hypertension   . Personal history of radiation therapy     Past Surgical History:  Procedure Laterality Date  . BREAST BIOPSY    . BREAST LUMPECTOMY Right    1998    Family History  Problem Relation Age of Onset  . Cancer Mother   . Cancer Father     Social History   Socioeconomic History  . Marital status: Married    Spouse name: Not on file  . Number of children: Not on file  . Years of education: Not on file  . Highest education level: Not on file  Occupational History  . Not on file  Social Needs  . Financial resource strain: Not on file  . Food insecurity:    Worry: Not  on file    Inability: Not on file  . Transportation needs:    Medical: Not on file    Non-medical: Not on file  Tobacco Use  . Smoking status: Never Smoker  . Smokeless tobacco: Never Used  Substance and Sexual Activity  . Alcohol use: No  . Drug use: No  . Sexual activity: Not on file  Lifestyle  . Physical activity:    Days per week: Not on file    Minutes per session: Not on file  . Stress: Not on file  Relationships  . Social connections:    Talks on phone: Not on file    Gets together: Not on file    Attends religious service: Not on file    Active member of club or organization: Not on file    Attends meetings of clubs or organizations: Not on file    Relationship status: Not on file  . Intimate partner violence:    Fear of current or ex partner: Not on file    Emotionally abused: Not on file    Physically abused: Not on file    Forced sexual activity: Not on file  Other Topics Concern  . Not on file  Social History Narrative   HPOA is her husband, Rifky Lapre.     She would want CPR- no feeding tube or "heroic measures."   The PMH, PSH, Social History, Family History, Medications, and allergies have been reviewed in [facility], and have been updated if relevant.    Review of Systems  Constitutional: Negative.   HENT: Negative.   Eyes: Negative.   Respiratory: Negative.   Cardiovascular: Negative.   Gastrointestinal: Negative.   Endocrine: Negative.   Genitourinary: Negative.   Musculoskeletal: Negative.   Skin: Negative.   Neurological: Negative.   Hematological: Negative.   Psychiatric/Behavioral: Negative.   All other systems reviewed and are negative.      Objective:    BP (!) 166/88 (BP Location: Left Arm, Cuff Size: Normal)   Pulse 78   Temp 98 F (36.7 C) (Oral)   Ht '5\' 3"'  (1.6 m)   Wt 156 lb (70.8 kg)   SpO2 99%   BMI 27.63 kg/m   BP Readings from Last 3 Encounters:  09/18/18 (!) 166/88  11/28/17 138/86  08/07/17 136/80    Physical  Exam  Constitutional: She is oriented to person, place, and time. She appears well-developed  and well-nourished. No distress.  HENT:  Head: Normocephalic and atraumatic.  Eyes: Conjunctivae are normal.  Neck: Normal range of motion.  Cardiovascular: Normal rate and regular rhythm.  Pulmonary/Chest: Effort normal and breath sounds normal.  Musculoskeletal: Normal range of motion.        General: No edema.  Neurological: She is alert and oriented to person, place, and time. No cranial nerve deficit.  Skin: Skin is warm and dry. She is not diaphoretic.  Psychiatric: She has a normal mood and affect. Her behavior is normal. Judgment and thought content normal.  Nursing note and vitals reviewed.         Assessment & Plan:   Essential hypertension - Plan: Comp Met (CMET), PTH, Intact and Calcium  Pure hypercholesterolemia  Hypercalcemia - Plan: PTH, Intact and Calcium No follow-ups on file.

## 2018-09-18 NOTE — Patient Instructions (Signed)
Great to see you.  We are adding quinapril 5 mg daily to your Norvasc 5 mg daily.  Check your blood pressure daily for next two weeks and please update me with the readings.

## 2018-09-18 NOTE — Assessment & Plan Note (Signed)
She has decreased her dose of calcium.  Repeat calcium and PTH today. The patient indicates understanding of these issues and agrees with the plan.

## 2018-09-20 LAB — PTH, INTACT AND CALCIUM: PTH: 67 pg/mL — ABNORMAL HIGH (ref 14–64)

## 2018-09-27 ENCOUNTER — Telehealth: Payer: Self-pay

## 2018-09-27 DIAGNOSIS — E559 Vitamin D deficiency, unspecified: Secondary | ICD-10-CM

## 2018-09-27 NOTE — Telephone Encounter (Signed)
Patient aware of needed labs/she will call when she comes [place] from the beach to schedule the Vit-Aeden Matranga and 24 hour urine/future orders placed/thx dmf

## 2018-09-27 NOTE — Telephone Encounter (Signed)
-----   Message from Lucille Passy, MD sent at 09/21/2018  3:04 PM EST ----- It is slightly elevated.  At this point, we usually recheck a VItamin Sidni Fusco and a 24 urine.  Would you mind looking up how to order the 24 hour urine?  And then add a Vit Sharmayne Jablon?  Thanks sooo much!!!

## 2018-10-04 ENCOUNTER — Other Ambulatory Visit (INDEPENDENT_AMBULATORY_CARE_PROVIDER_SITE_OTHER): Payer: Medicare Other

## 2018-10-04 DIAGNOSIS — E559 Vitamin D deficiency, unspecified: Secondary | ICD-10-CM

## 2018-10-04 LAB — VITAMIN D 25 HYDROXY (VIT D DEFICIENCY, FRACTURES): VITD: 32.12 ng/mL (ref 30.00–100.00)

## 2018-10-08 ENCOUNTER — Other Ambulatory Visit: Payer: Medicare Other

## 2018-10-09 LAB — PROTEIN, URINE, 24 HOUR: Protein, 24H Urine: 108 mg/24 h (ref 0–149)

## 2018-10-10 ENCOUNTER — Other Ambulatory Visit: Payer: Self-pay | Admitting: Family Medicine

## 2018-10-10 NOTE — Telephone Encounter (Signed)
Reviewed lab results and physician's note with patient. Requested for quinapril 5 MG tab to be changed to 90 day supply. I will take care of this for her. When asked how she is feeling she stated great.   TC to [facility]-requested 90 day supply with 0 refills.

## 2018-11-15 DIAGNOSIS — D2262 Melanocytic nevi of left upper limb, including shoulder: Secondary | ICD-10-CM | POA: Diagnosis not present

## 2018-11-15 DIAGNOSIS — D2271 Melanocytic nevi of right lower limb, including hip: Secondary | ICD-10-CM | POA: Diagnosis not present

## 2018-11-15 DIAGNOSIS — D225 Melanocytic nevi of trunk: Secondary | ICD-10-CM | POA: Diagnosis not present

## 2018-11-15 DIAGNOSIS — Z08 Encounter for follow-up examination after completed treatment for malignant neoplasm: Secondary | ICD-10-CM | POA: Diagnosis not present

## 2018-11-15 DIAGNOSIS — D485 Neoplasm of uncertain behavior of skin: Secondary | ICD-10-CM | POA: Diagnosis not present

## 2018-11-15 DIAGNOSIS — Z85828 Personal history of other malignant neoplasm of skin: Secondary | ICD-10-CM | POA: Diagnosis not present

## 2018-11-15 DIAGNOSIS — C44519 Basal cell carcinoma of skin of other part of trunk: Secondary | ICD-10-CM | POA: Diagnosis not present

## 2018-11-15 DIAGNOSIS — D2272 Melanocytic nevi of left lower limb, including hip: Secondary | ICD-10-CM | POA: Diagnosis not present

## 2018-11-15 DIAGNOSIS — L821 Other seborrheic keratosis: Secondary | ICD-10-CM | POA: Diagnosis not present

## 2018-11-15 DIAGNOSIS — D2261 Melanocytic nevi of right upper limb, including shoulder: Secondary | ICD-10-CM | POA: Diagnosis not present

## 2018-12-05 DIAGNOSIS — C44519 Basal cell carcinoma of skin of other part of trunk: Secondary | ICD-10-CM | POA: Diagnosis not present

## 2018-12-16 ENCOUNTER — Other Ambulatory Visit: Payer: Self-pay | Admitting: Family Medicine

## 2018-12-26 ENCOUNTER — Ambulatory Visit: Payer: Medicare Other | Admitting: *Deleted

## 2019-01-05 ENCOUNTER — Other Ambulatory Visit: Payer: Self-pay | Admitting: Family Medicine

## 2019-02-20 IMAGING — MG DIGITAL SCREENING BILATERAL MAMMOGRAM WITH TOMO AND CAD
8 series · 9 of 24 positions shown · non-contrast
Comparison: Previous exam(s).

CLINICAL DATA: Screening.

EXAM:
DIGITAL SCREENING BILATERAL MAMMOGRAM WITH TOMO AND CAD

[L MLO synth-2D]
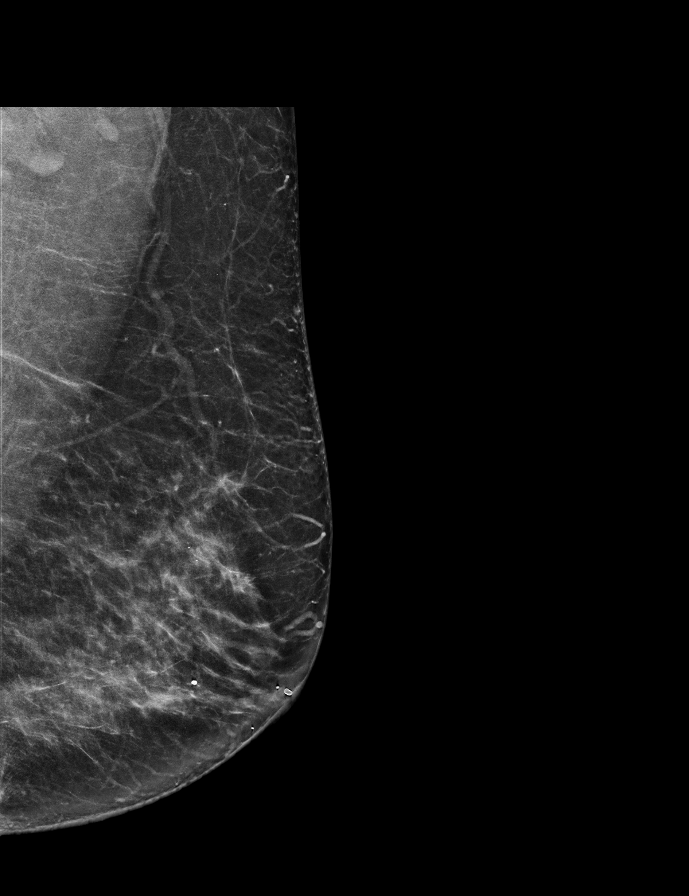

[L CC synth-2D]
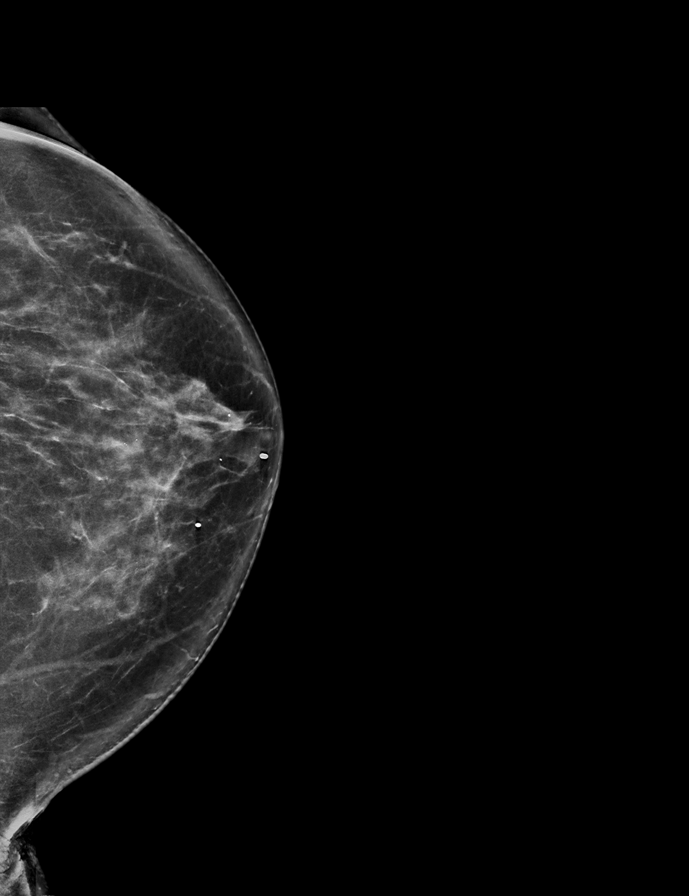

[R CC synth-2D]
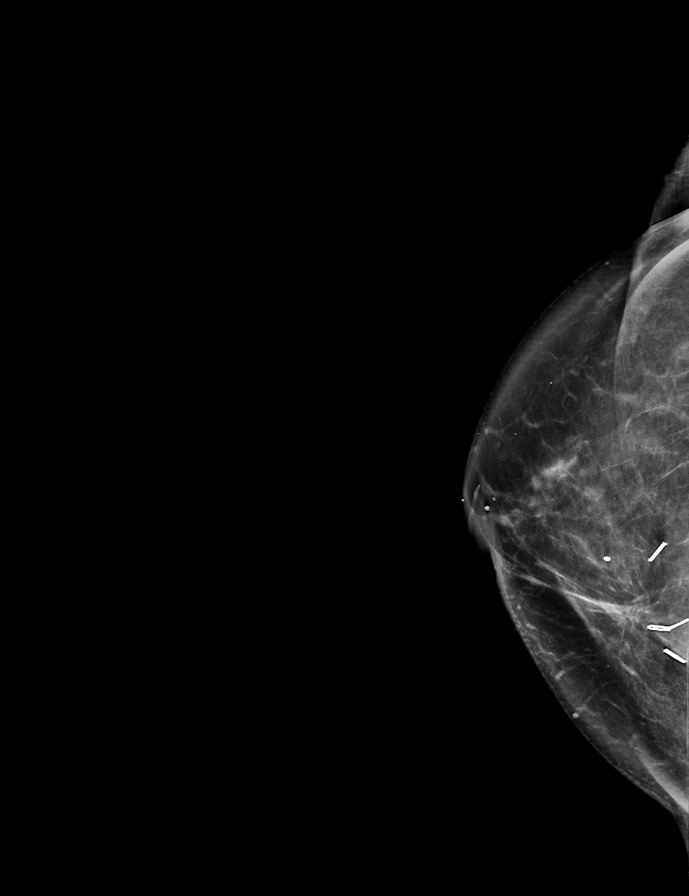

[R MLO synth-2D]
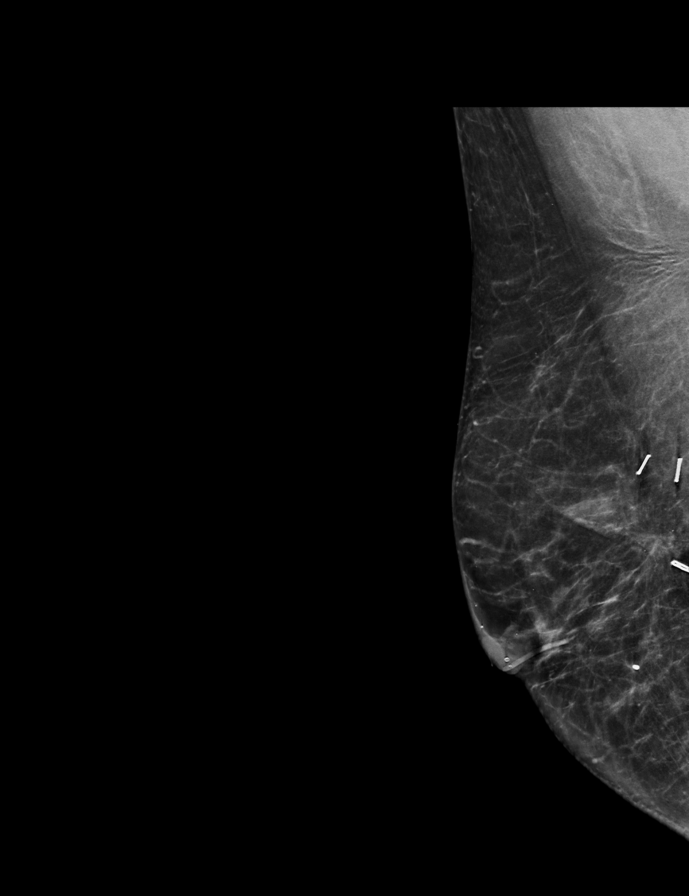

[L CC tomo · 2 of 76 frames shown]
[frame 25/76]
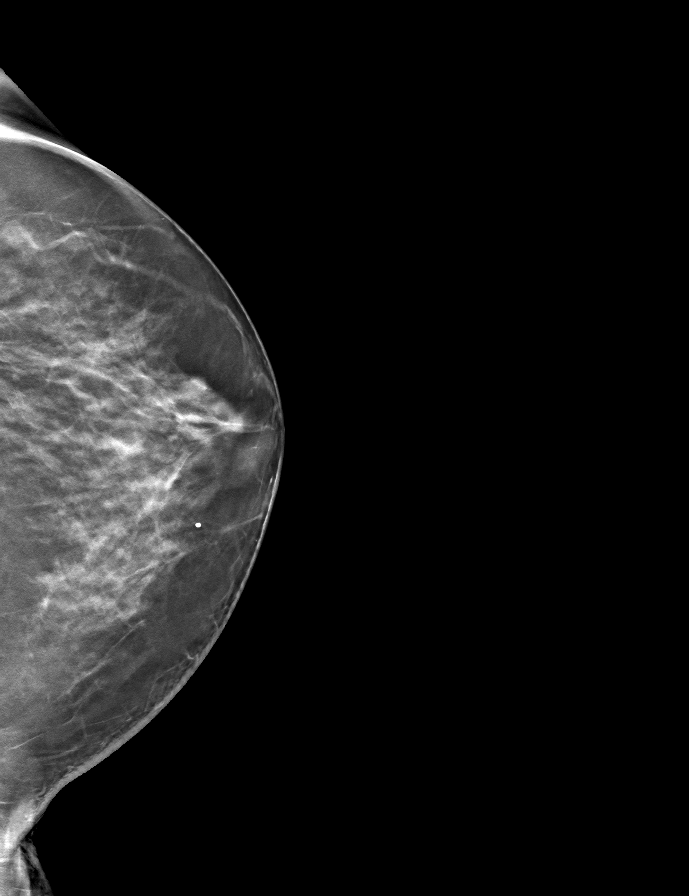
[frame 39/76]
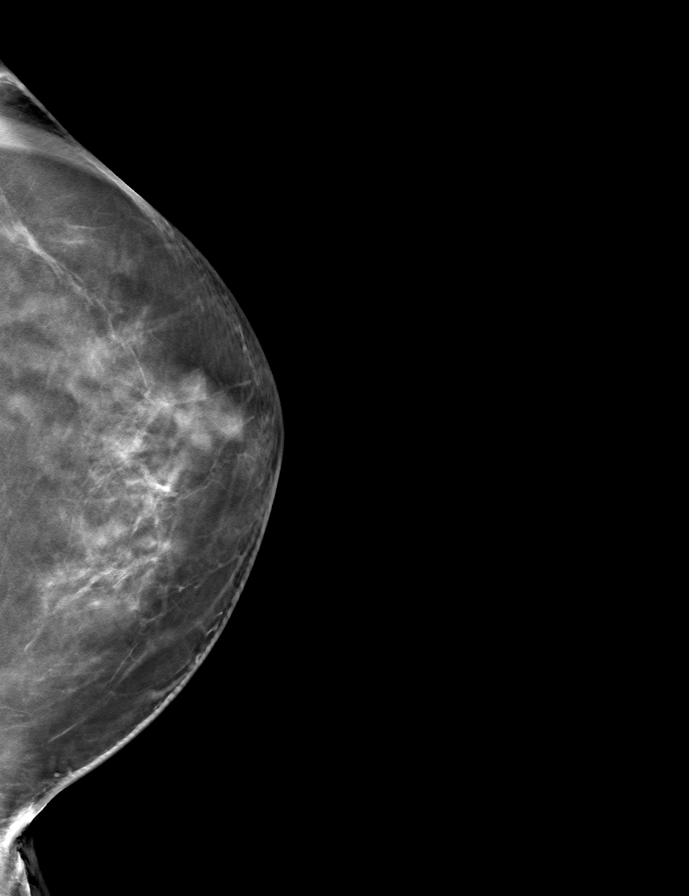

[R MLO tomo · tomo slice 31/61.0]
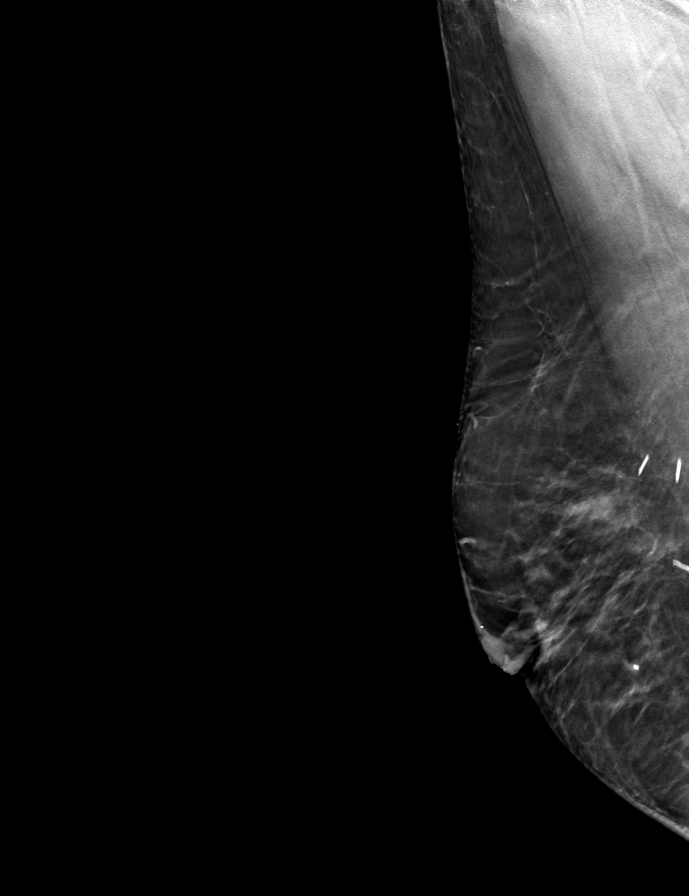

[L MLO tomo · tomo slice 35/68.0]
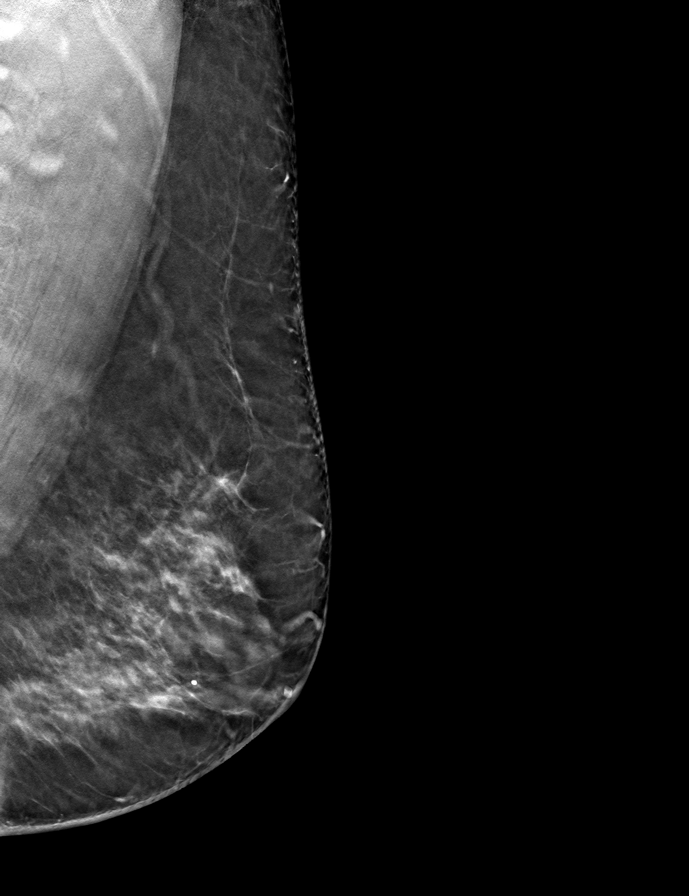

[R CC tomo · tomo slice 34/67.0]
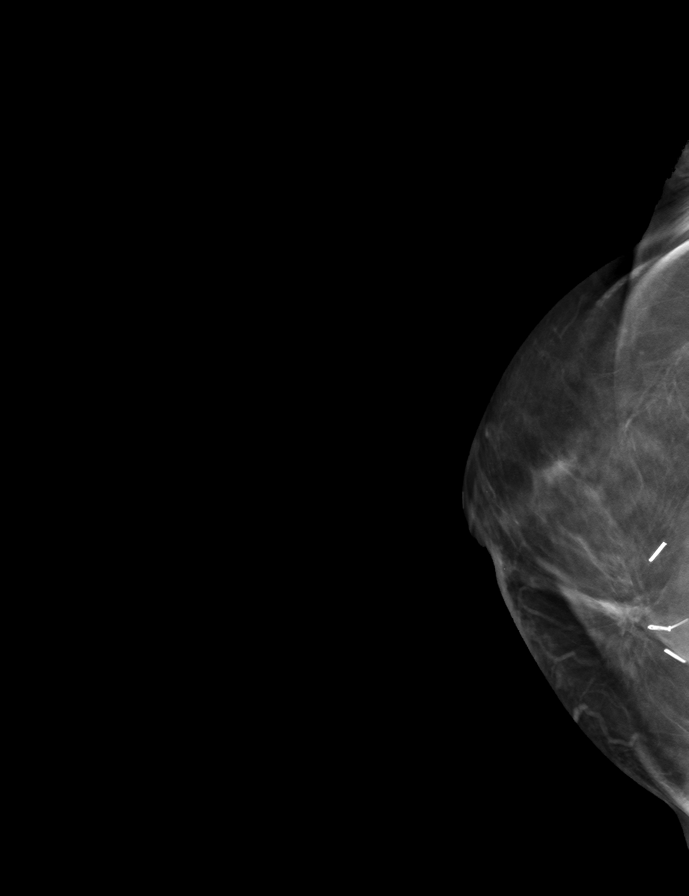

[9 of 24 positions shown; findings below may reference images not displayed]

ACR Breast Density Category c: The breast tissue is heterogeneously
dense, which may obscure small masses.
FINDINGS: There are no findings suspicious for malignancy. Stable postsurgical
changes on the right. Images were processed with CAD.
IMPRESSION: No mammographic evidence of malignancy. A result letter of this
screening mammogram will be mailed directly to the patient.

RECOMMENDATION:
Screening mammogram in one year. (Code:TJ-S-REQ)

BI-RADS CATEGORY  2: Benign.

## 2019-02-27 ENCOUNTER — Ambulatory Visit: Payer: Medicare Other | Admitting: Family Medicine

## 2019-02-27 ENCOUNTER — Ambulatory Visit: Payer: Medicare Other | Admitting: *Deleted

## 2019-02-28 ENCOUNTER — Ambulatory Visit: Payer: Medicare Other | Admitting: Family Medicine

## 2019-03-01 DIAGNOSIS — B029 Zoster without complications: Secondary | ICD-10-CM | POA: Diagnosis not present

## 2019-03-04 ENCOUNTER — Ambulatory Visit: Payer: Medicare Other | Admitting: Family Medicine

## 2019-03-22 ENCOUNTER — Other Ambulatory Visit: Payer: Self-pay | Admitting: Family Medicine

## 2019-03-29 DIAGNOSIS — H4423 Degenerative myopia, bilateral: Secondary | ICD-10-CM | POA: Diagnosis not present

## 2019-04-23 NOTE — Progress Notes (Addendum)
Subjective:   Heidi Sanchez is a 78 y.o. female who presents for Medicare Annual (Subsequent) preventive examination.  Review of Systems: No ROS.  See social history for additional risk factors. Cardiac Risk Factors include: advanced age (>88men, >58 women);dyslipidemia;hypertension Sleep patterns: Takes Benadryl at night.  Home Safety/Smoke Alarms: Feels safe in home. Smoke alarms in place.  Lives with husband in 2 story town home. Stays on 1st floor. Son and daughter in law live next door. Walk in shower with grab rails.    Female:       Mammo-8/8/419       Dexa scan-  10/15/15      CCS-10/29/13     Objective:     Vitals: BP 136/74 (BP Location: Left Arm, Patient Position: Sitting, Cuff Size: Normal)   Pulse 73   Temp 98.2 F (36.8 C) (Oral)   Ht 5\' 3"  (1.6 m)   Wt 152 lb 9.6 oz (69.2 kg)   SpO2 99%   BMI 27.03 kg/m   Body mass index is 27.03 kg/m.  Advanced Directives 04/24/2019 09/15/2016  Does Patient Have a Medical Advance Directive? Yes Yes  Type of Paramedic of [place];Living will Heidi Sanchez;Living will  Does patient want to make changes to medical advance directive? No - Patient declined -  Copy of [place] in Chart? No - copy requested No - copy requested    Tobacco Social History   Tobacco Use  Smoking Status Never Smoker  Smokeless Tobacco Never Used     Counseling given: Not Answered   Clinical Intake:     Pain : No/denies pain                 Past Medical History:  Diagnosis Date  . Breast cancer ([place])   . Cancer [facility])    breast  . Colonic polyp   . Hyperlipidemia   . Hypertension   . Personal history of radiation therapy    Past Surgical History:  Procedure Laterality Date  . BREAST BIOPSY    . BREAST LUMPECTOMY Right    1998   Family History  Problem Relation Age of Onset  . Cancer Mother   . Cancer Father    Social History   Socioeconomic History  .  Marital status: Married    Spouse name: Not on file  . Number of children: Not on file  . Years of education: Not on file  . Highest education level: Not on file  Occupational History  . Not on file  Social Needs  . Financial resource strain: Not on file  . Food insecurity    Worry: Not on file    Inability: Not on file  . Transportation needs    Medical: Not on file    Non-medical: Not on file  Tobacco Use  . Smoking status: Never Smoker  . Smokeless tobacco: Never Used  Substance and Sexual Activity  . Alcohol use: No  . Drug use: No  . Sexual activity: Not on file  Lifestyle  . Physical activity    Days per week: Not on file    Minutes per session: Not on file  . Stress: Not on file  Relationships  . Social Herbalist on phone: Not on file    Gets together: Not on file    Attends religious service: Not on file    Active member of club or organization: Not on file  Attends meetings of clubs or organizations: Not on file    Relationship status: Not on file  Other Topics Concern  . Not on file  Social History Narrative   HPOA is her husband, Heidi Sanchez.     She would want CPR- no feeding tube or "heroic measures."    Outpatient Encounter Medications as of 04/24/2019  Medication Sig  . amLODipine (NORVASC) 5 MG tablet TAKE 1 TABLET BY MOUTH EVERY DAY  . Calcium-Magnesium-Vitamin D (CALCIUM 1200+D3 PO) Take 1 capsule by mouth daily.  . diphenhydrAMINE (BENADRYL) 25 MG tablet Take 25 mg by mouth as needed.  . fluticasone (FLONASE) 50 MCG/ACT nasal spray Place 2 sprays into both nostrils daily.  . Multiple Vitamins-Minerals (OCUVITE PRESERVISION) TABS 1 tablet 2 (two) times daily. Use as directed  . Omega-3 Fatty Acids (FISH OIL) 1000 MG CAPS Take 1 capsule (1,000 mg total) by mouth daily.  . quinapril (ACCUPRIL) 5 MG tablet TAKE 1 TABLET BY MOUTH AT BEDTIME  . simvastatin (ZOCOR) 40 MG tablet TAKE 1 TABLET (40 MG TOTAL) BY MOUTH AT BEDTIME.   No  facility-administered encounter medications on file as of 04/24/2019.     Activities of Daily Living In your present state of health, do you have any difficulty performing the following activities: 04/24/2019 02/26/2019  Hearing? N N  Vision? Y N  Difficulty concentrating or making decisions? N N  Walking or climbing stairs? N N  Dressing or bathing? N N  Doing errands, shopping? N N  Preparing Food and eating ? N -  Using the Toilet? N -  In the past six months, have you accidently leaked urine? N -  Do you have problems with loss of bowel control? N -  Managing your Medications? N -  Managing your Finances? N -  Housekeeping or managing your Housekeeping? N -  Some recent data might be hidden    Patient Care Team: Lucille Passy, MD as PCP - General Oneta Rack, MD as Consulting Physician (Dermatology) Leandrew Koyanagi, MD as Referring Physician (Ophthalmology) Vernon Prey, MD as Referring Physician (Ophthalmology) Boykin Nearing, DDS as Consulting Physician (Dentistry)    Assessment:   This is a routine wellness examination for Heidi Sanchez. Physical assessment deferred to PCP.  Exercise Activities and Dietary recommendations Current Exercise Habits: Home exercise routine, Type of exercise: strength training/weights;stretching;treadmill;yoga, Time (Minutes): 30, Frequency (Times/Week): 5, Weekly Exercise (Minutes/Week): 150, Intensity: Mild, Exercise limited by: None identified Diet (meal preparation, eat out, water intake, caffeinated beverages, dairy products, fruits and vegetables): well balanced, on average, 3 meals per day       Goals    . Increase physical activity     Starting 09/19/16, I will continue to exercise at least 60 min 4 days per week.        Fall Risk Fall Risk  04/24/2019 09/18/2018 11/28/2017 09/15/2016 09/09/2016  Falls in the past year? 0 0 No No No  Comment - - - - Emmi Telephone Survey: data to providers prior to load  Number falls in past  yr: - - - - -  Comment - - - - -  Injury with Fall? - - - - -  Follow up - Falls evaluation completed - - -     Depression Screen PHQ 2/9 Scores 04/24/2019 11/28/2017 09/15/2016 09/14/2015  PHQ - 2 Score 0 0 0 0     Cognitive Function Ad8 score reviewed for issues:  Issues making decisions:no  Less interest in hobbies /  activities:no  Repeats questions, stories (family complaining):no  Trouble using ordinary gadgets (microwave, computer, phone):no  Forgets the month or year: no  Mismanaging finances: no  Remembering appts:no  Daily problems with thinking and/or memory:no Ad8 score is=0   MMSE - Mini Mental State Exam 09/15/2016  Orientation to time 5  Orientation to Place 5  Registration 3  Attention/ Calculation 0  Recall 3  Language- name 2 objects 0  Language- repeat 1  Language- follow 3 step command 3  Language- read & follow direction 0  Write a sentence 0  Copy design 0  Total score 20        Immunization History  Administered Date(s) Administered  . Influenza, High Dose Seasonal PF 07/10/2017, 07/04/2018  . Influenza-Unspecified 07/11/2016  . Pneumococcal Conjugate-13 05/26/2014  . Pneumococcal Polysaccharide-23 09/15/2016  . Td 02/11/2009  . Zoster 02/15/2010   Screening Tests Health Maintenance  Topic Date Due  . TETANUS/TDAP  10/04/2019 (Originally 02/12/2019)  . INFLUENZA VACCINE  05/04/2019  . DEXA SCAN  Completed  . PNA vac Low Risk Adult  Completed      Plan:    Please schedule your next medicare wellness visit with me in 1 yr.  Continue to eat heart healthy diet (full of fruits, vegetables, whole grains, lean protein, water--limit salt, fat, and sugar intake) and increase physical activity as tolerated.  Continue doing brain stimulating activities (puzzles, reading, adult coloring books, staying active) to keep memory sharp.   Bring a copy of your living will and/or healthcare power of attorney to your next office visit.   I  have personally reviewed and noted the following in the patient's chart:   . Medical and social history . Use of alcohol, tobacco or illicit drugs  . Current medications and supplements . Functional ability and status . Nutritional status . Physical activity . Advanced directives . List of other physicians . Hospitalizations, surgeries, and ER visits in previous 12 months . Vitals . Screenings to include cognitive, depression, and falls . Referrals and appointments  In addition, I have reviewed and discussed with patient certain preventive protocols, quality metrics, and best practice recommendations. A written personalized care plan for preventive services as well as general preventive health recommendations were provided to patient.     Naaman Plummer [place], [place]  04/24/2019

## 2019-04-24 ENCOUNTER — Ambulatory Visit (INDEPENDENT_AMBULATORY_CARE_PROVIDER_SITE_OTHER): Payer: Medicare Other | Admitting: *Deleted

## 2019-04-24 ENCOUNTER — Encounter: Payer: Self-pay | Admitting: *Deleted

## 2019-04-24 VITALS — BP 136/74 | HR 73 | Temp 98.2°F | Ht 63.0 in | Wt 152.6 lb

## 2019-04-24 DIAGNOSIS — Z0000 Encounter for general adult medical examination without abnormal findings: Secondary | ICD-10-CM | POA: Diagnosis not present

## 2019-04-24 NOTE — Patient Instructions (Signed)
Please schedule your next medicare wellness visit with me in 1 yr.  Continue to eat heart healthy diet (full of fruits, vegetables, whole grains, lean protein, water--limit salt, fat, and sugar intake) and increase physical activity as tolerated.  Continue doing brain stimulating activities (puzzles, reading, adult coloring books, staying active) to keep memory sharp.   Bring a copy of your living will and/or healthcare power of attorney to your next office visit.   Ms. Heidi Sanchez , Thank you for taking time to come for your Medicare Wellness Visit. I appreciate your ongoing commitment to your health goals. Please review the following plan we discussed and let me know if I can assist you in the future.   These are the goals we discussed: Goals    . Increase physical activity     Starting 09/19/16, I will continue to exercise at least 60 min 4 days per week.        This is a list of the screening recommended for you and due dates:  Health Maintenance  Topic Date Due  . Tetanus Vaccine  10/04/2019*  . Flu Shot  05/04/2019  . DEXA scan (bone density measurement)  Completed  . Pneumonia vaccines  Completed  *Topic was postponed. The date shown is not the original due date.    Health Maintenance After Age 12 After age 23, you are at a higher risk for certain long-term diseases and infections as well as injuries from falls. Falls are a major cause of broken bones and head injuries in people who are older than age 39. Getting regular preventive care can help to keep you healthy and well. Preventive care includes getting regular testing and making lifestyle changes as recommended by your health care provider. Talk with your health care provider about:  Which screenings and tests you should have. A screening is a test that checks for a disease when you have no symptoms.  A diet and exercise plan that is right for you. What should I know about screenings and tests to prevent falls? Screening  and testing are the best ways to find a health problem early. Early diagnosis and treatment give you the best chance of managing medical conditions that are common after age 83. Certain conditions and lifestyle choices may make you more likely to have a fall. Your health care provider may recommend:  Regular vision checks. Poor vision and conditions such as cataracts can make you more likely to have a fall. If you wear glasses, make sure to get your prescription updated if your vision changes.  Medicine review. Work with your health care provider to regularly review all of the medicines you are taking, including over-the-counter medicines. Ask your health care provider about any side effects that may make you more likely to have a fall. Tell your health care provider if any medicines that you take make you feel dizzy or sleepy.  Osteoporosis screening. Osteoporosis is a condition that causes the bones to get weaker. This can make the bones weak and cause them to break more easily.  Blood pressure screening. Blood pressure changes and medicines to control blood pressure can make you feel dizzy.  Strength and balance checks. Your health care provider may recommend certain tests to check your strength and balance while standing, walking, or changing positions.  Foot health exam. Foot pain and numbness, as well as not wearing proper footwear, can make you more likely to have a fall.  Depression screening. You may be more likely to  have a fall if you have a fear of falling, feel emotionally low, or feel unable to do activities that you used to do.  Alcohol use screening. Using too much alcohol can affect your balance and may make you more likely to have a fall. What actions can I take to lower my risk of falls? General instructions  Talk with your health care provider about your risks for falling. Tell your health care provider if: ? You fall. Be sure to tell your health care provider about all  falls, even ones that seem minor. ? You feel dizzy, sleepy, or off-balance.  Take over-the-counter and prescription medicines only as told by your health care provider. These include any supplements.  Eat a healthy diet and maintain a healthy weight. A healthy diet includes low-fat dairy products, low-fat (lean) meats, and fiber from whole grains, beans, and lots of fruits and vegetables. Home safety  Remove any tripping hazards, such as rugs, cords, and clutter.  Install safety equipment such as grab bars in bathrooms and safety rails on stairs.  Keep rooms and walkways well-lit. Activity   Follow a regular exercise program to stay fit. This will help you maintain your balance. Ask your health care provider what types of exercise are appropriate for you.  If you need a cane or walker, use it as recommended by your health care provider.  Wear supportive shoes that have nonskid soles. Lifestyle  Do not drink alcohol if your health care provider tells you not to drink.  If you drink alcohol, limit how much you have: ? 0-1 drink a day for women. ? 0-2 drinks a day for men.  Be aware of how much alcohol is in your drink. In the [place] one drink equals one typical bottle of beer (12 oz), one-half glass of wine (5 oz), or one shot of hard liquor (1 oz).  Do not use any products that contain nicotine or tobacco, such as cigarettes and e-cigarettes. If you need help quitting, ask your health care provider. Summary  Having a healthy lifestyle and getting preventive care can help to protect your health and wellness after age 48.  Screening and testing are the best way to find a health problem early and help you avoid having a fall. Early diagnosis and treatment give you the best chance for managing medical conditions that are more common for people who are older than age 91.  Falls are a major cause of broken bones and head injuries in people who are older than age 16. Take precautions to  prevent a fall at home.  Work with your health care provider to learn what changes you can make to improve your health and wellness and to prevent falls. This information is not intended to replace advice given to you by your health care provider. Make sure you discuss any questions you have with your health care provider. Document Released: 08/02/2017 Document Revised: 01/10/2019 Document Reviewed: 08/02/2017 Elsevier Patient Education  2020 [employer].

## 2019-05-02 NOTE — Progress Notes (Signed)
Medical screening examination/treatment/procedure(s) were performed by the Wellness Coach, RN. As primary care provider I was immediately available for consulation/collaboration. I agree with above documentation. Charlotte Nche, AGNP-C 

## 2019-05-30 ENCOUNTER — Other Ambulatory Visit: Payer: Self-pay | Admitting: Family Medicine

## 2019-05-30 DIAGNOSIS — Z1231 Encounter for screening mammogram for malignant neoplasm of breast: Secondary | ICD-10-CM

## 2019-06-02 NOTE — Progress Notes (Signed)
Subjective:   Patient ID: Heidi Sanchez, female    DOB: [birth year], 78 y.o.   MRN: [id number]  Heidi Sanchez is a pleasant 78 y.o. year old female who presents to clinic today with Follow-up (Pt screened in vehicle. She is not currently fasting. She agrees to get the flu shot.)  on 06/03/2019  HPI: She is here with her husband, Heidi Sanchez today.  Here for follow up. Saw Heidi Fleet, RN on 04/24/19 for annual medicare wellness visit. Mammogram scheduled for 07/15/19.   When I last saw her in 09/2018, she was having a difficult time unfortunately.   Son died in 07-11-2018 unexpectedly.  He had a lot of medical problems and they feel they are coping okay but still has been very hard.  They moved from [facility] to [place] recently to live closer to their other son.  Depression screen [facility] 2/9 04/24/2019 11/28/2017 09/15/2016 09/14/2015 05/26/2014  Decreased Interest 0 0 0 0 0  Down, Depressed, Hopeless 0 0 0 0 0  PHQ - 2 Score 0 0 0 0 0     HTN- has been under better controled on Accupril 5 mg daily and norvasc 5 mg daily.  BP Readings from Last 3 Encounters:  06/03/19 128/72  04/24/19 136/74  09/18/18 (!) 166/88   Wt Readings from Last 3 Encounters:  06/03/19 153 lb 12.8 oz (69.8 kg)  04/24/19 152 lb 9.6 oz (69.2 kg)  09/18/18 156 lb (70.8 kg)    HLD- has also been well controlled on current dose of zocor.  Lab Results  Component Value Date   CHOL 142 11/27/2017   HDL 25.40 (L) 11/27/2017   LDLCALC 81 11/27/2017   TRIG 178.0 (H) 11/27/2017   CHOLHDL 6 11/27/2017   Her calcium was elevated so I advised her to decrease her calcium supplement from 2 capsules to one capsule daily last year.  Lab Results  Component Value Date   CALCIUM 10.8 (H) 09/18/2018   CALCIUM CANCELED 09/18/2018    Current Outpatient Medications on File Prior to Visit  Medication Sig Dispense Refill  . amLODipine (NORVASC) 5 MG tablet TAKE 1 TABLET BY MOUTH EVERY DAY 90 tablet 1  .  Calcium-Magnesium-Vitamin D (CALCIUM 1200+D3 PO) Take 1 capsule by mouth daily.    . diphenhydrAMINE (BENADRYL) 25 MG tablet Take 25 mg by mouth as needed.    . Multiple Vitamins-Minerals (OCUVITE PRESERVISION) TABS 1 tablet 2 (two) times daily. Use as directed    . Omega-3 Fatty Acids (FISH OIL) 1000 MG CAPS Take 1 capsule (1,000 mg total) by mouth daily. 90 capsule 3  . quinapril (ACCUPRIL) 5 MG tablet TAKE 1 TABLET BY MOUTH AT BEDTIME 90 tablet 0  . simvastatin (ZOCOR) 40 MG tablet TAKE 1 TABLET (40 MG TOTAL) BY MOUTH AT BEDTIME. 90 tablet 2   No current facility-administered medications on file prior to visit.     Allergies  Allergen Reactions  . Codeine     REACTION: Nigthmares    Past Medical History:  Diagnosis Date  . Breast cancer ([place])   . Cancer [facility])    breast  . Colonic polyp   . Hyperlipidemia   . Hypertension   . Personal history of radiation therapy     Past Surgical History:  Procedure Laterality Date  . BREAST BIOPSY    . BREAST LUMPECTOMY Right    1998    Family History  Problem Relation Age of Onset  . Cancer Mother   .  Cancer Father     Social History   Socioeconomic History  . Marital status: Married    Spouse name: Not on file  . Number of children: Not on file  . Years of education: Not on file  . Highest education level: Not on file  Occupational History  . Not on file  Social Needs  . Financial resource strain: Not on file  . Food insecurity    Worry: Not on file    Inability: Not on file  . Transportation needs    Medical: Not on file    Non-medical: Not on file  Tobacco Use  . Smoking status: Never Smoker  . Smokeless tobacco: Never Used  Substance and Sexual Activity  . Alcohol use: No  . Drug use: No  . Sexual activity: Not on file  Lifestyle  . Physical activity    Days per week: Not on file    Minutes per session: Not on file  . Stress: Not on file  Relationships  . Social Herbalist on phone: Not on  file    Gets together: Not on file    Attends religious service: Not on file    Active member of club or organization: Not on file    Attends meetings of clubs or organizations: Not on file    Relationship status: Not on file  . Intimate partner violence    Fear of current or ex partner: Not on file    Emotionally abused: Not on file    Physically abused: Not on file    Forced sexual activity: Not on file  Other Topics Concern  . Not on file  Social History Narrative   HPOA is her husband, Heidi Sanchez.     She would want CPR- no feeding tube or "heroic measures."   The PMH, PSH, Social History, Family History, Medications, and allergies have been reviewed in [facility], and have been updated if relevant.    Review of Systems  Constitutional: Negative.   HENT: Negative.   Eyes: Negative.   Respiratory: Negative.   Cardiovascular: Negative.   Gastrointestinal: Negative.   Endocrine: Negative.   Genitourinary: Negative.   Musculoskeletal: Negative.   Skin: Negative.   Neurological: Negative.   Hematological: Negative.   Psychiatric/Behavioral: Negative.   All other systems reviewed and are negative.      Objective:    BP 128/72   Pulse 74   Temp 98.1 F (36.7 C) (Oral)   Ht 5\' 3"  (1.6 m)   Wt 153 lb 12.8 oz (69.8 kg)   SpO2 98%   BMI 27.24 kg/m   BP Readings from Last 3 Encounters:  06/03/19 128/72  04/24/19 136/74  09/18/18 (!) 166/88    Physical Exam  Constitutional: She is oriented to person, place, and time. She appears well-developed and well-nourished. No distress.  HENT:  Head: Normocephalic and atraumatic.  Eyes: Conjunctivae are normal.  Neck: Normal range of motion.  Cardiovascular: Normal rate and regular rhythm.  Pulmonary/Chest: Effort normal and breath sounds normal.  Musculoskeletal: Normal range of motion.        General: No edema.  Neurological: She is alert and oriented to person, place, and time. No cranial nerve deficit.  Skin: Skin is  warm and dry. She is not diaphoretic.  Psychiatric: She has a normal mood and affect. Her behavior is normal. Judgment and thought content normal.  Nursing note and vitals reviewed.         Assessment &  Plan:   Hypercalcemia - Plan: PTH, Intact and Calcium  Essential hypertension  Pure hypercholesterolemia - Plan: CBC with Differential/Platelet, Comprehensive metabolic panel, Lipid panel, TSH  Vitamin D deficiency - Plan: Vitamin D (25 hydroxy)  Osteopenia of lower leg, unspecified laterality - Plan: Vitamin D (25 hydroxy), DG Bone Density  Grief  BREAST CANCER, HX OF  Need for influenza vaccination - Plan: Flu Vaccine QUAD High Dose(Fluad)  Other specified disorders of bone density and structure, multiple sites  - Plan: DG Bone Density No follow-ups on file.

## 2019-06-03 ENCOUNTER — Ambulatory Visit (INDEPENDENT_AMBULATORY_CARE_PROVIDER_SITE_OTHER): Payer: Medicare Other | Admitting: Family Medicine

## 2019-06-03 ENCOUNTER — Encounter: Payer: Self-pay | Admitting: Family Medicine

## 2019-06-03 ENCOUNTER — Other Ambulatory Visit: Payer: Self-pay | Admitting: Family Medicine

## 2019-06-03 DIAGNOSIS — F4321 Adjustment disorder with depressed mood: Secondary | ICD-10-CM | POA: Diagnosis not present

## 2019-06-03 DIAGNOSIS — M8580 Other specified disorders of bone density and structure, unspecified site: Secondary | ICD-10-CM

## 2019-06-03 DIAGNOSIS — M85869 Other specified disorders of bone density and structure, unspecified lower leg: Secondary | ICD-10-CM

## 2019-06-03 DIAGNOSIS — I10 Essential (primary) hypertension: Secondary | ICD-10-CM

## 2019-06-03 DIAGNOSIS — Z853 Personal history of malignant neoplasm of breast: Secondary | ICD-10-CM

## 2019-06-03 DIAGNOSIS — E7800 Pure hypercholesterolemia, unspecified: Secondary | ICD-10-CM

## 2019-06-03 DIAGNOSIS — E559 Vitamin D deficiency, unspecified: Secondary | ICD-10-CM

## 2019-06-03 DIAGNOSIS — M8589 Other specified disorders of bone density and structure, multiple sites: Secondary | ICD-10-CM

## 2019-06-03 DIAGNOSIS — Z23 Encounter for immunization: Secondary | ICD-10-CM

## 2019-06-03 LAB — COMPREHENSIVE METABOLIC PANEL
ALT: 14 U/L (ref 0–35)
AST: 16 U/L (ref 0–37)
Albumin: 4.4 g/dL (ref 3.5–5.2)
Alkaline Phosphatase: 47 U/L (ref 39–117)
BUN: 14 mg/dL (ref 6–23)
CO2: 28 mEq/L (ref 19–32)
Calcium: 10.5 mg/dL (ref 8.4–10.5)
Chloride: 106 mEq/L (ref 96–112)
Creatinine, Ser: 0.79 mg/dL (ref 0.40–1.20)
GFR: 69.92 mL/min (ref >60.00)
Glucose, Bld: 70 mg/dL (ref 70–99)
Potassium: 4 mEq/L (ref 3.5–5.1)
Sodium: 140 mEq/L (ref 135–145)
Total Bilirubin: 0.4 mg/dL (ref 0.2–1.2)
Total Protein: 7.3 g/dL (ref 6.0–8.3)

## 2019-06-03 LAB — CBC WITH DIFFERENTIAL/PLATELET
Basophils Absolute: 0 10*3/uL (ref 0.0–0.1)
Basophils Relative: 0.6 % (ref 0.0–3.0)
Eosinophils Absolute: 0.1 10*3/uL (ref 0.0–0.7)
Eosinophils Relative: 1.1 % (ref 0.0–5.0)
HCT: 40.2 % (ref 36.0–46.0)
Hemoglobin: 13.5 g/dL (ref 12.0–15.0)
Lymphocytes Relative: 34.7 % (ref 12.0–46.0)
Lymphs Abs: 2.6 10*3/uL (ref 0.7–4.0)
MCHC: 33.6 g/dL (ref 30.0–36.0)
MCV: 97.9 fl (ref 78.0–100.0)
Monocytes Absolute: 0.9 10*3/uL (ref 0.1–1.0)
Monocytes Relative: 11.7 % (ref 3.0–12.0)
Neutro Abs: 4 10*3/uL (ref 1.4–7.7)
Neutrophils Relative %: 51.9 % (ref 43.0–77.0)
Platelets: 233 10*3/uL (ref 150.0–400.0)
RBC: 4.11 Mil/uL (ref 3.87–5.11)
RDW: 13.3 % (ref 11.5–15.5)
WBC: 7.6 10*3/uL (ref 4.0–10.5)

## 2019-06-03 LAB — LIPID PANEL
Cholesterol: 136 mg/dL (ref 0–200)
HDL: 29.2 mg/dL — ABNORMAL LOW (ref >39.00)
NonHDL: 107.08
Total CHOL/HDL Ratio: 5
Triglycerides: 244 mg/dL — ABNORMAL HIGH (ref 0.0–149.0)
VLDL: 48.8 mg/dL — ABNORMAL HIGH (ref 0.0–40.0)

## 2019-06-03 LAB — LDL CHOLESTEROL, DIRECT: Direct LDL: 81 mg/dL

## 2019-06-03 LAB — VITAMIN D 25 HYDROXY (VIT D DEFICIENCY, FRACTURES): VITD: 27.37 ng/mL — ABNORMAL LOW (ref 30.00–100.00)

## 2019-06-03 LAB — TSH: TSH: 1.35 u[IU]/mL (ref 0.35–4.50)

## 2019-06-03 MED ORDER — FLUTICASONE PROPIONATE 50 MCG/ACT NA SUSP: 2.0000 | Freq: Every day | NASAL | 6 refills | Status: DC

## 2019-06-03 NOTE — Assessment & Plan Note (Signed)
Mammogram scheduled for 07/15/19.

## 2019-06-03 NOTE — Assessment & Plan Note (Signed)
Repeat calcium and PTH today. We did decrease her calcium supplement from 2 capsules to 1 capsule at last OV.

## 2019-06-03 NOTE — Assessment & Plan Note (Signed)
Feels she is doing much better than last year, coping well.

## 2019-06-03 NOTE — Assessment & Plan Note (Signed)
Due for dexa.   DEXA ordered and phone number for breast center given to pt to schedule DEXA on same day that mammogram is already scheduled.

## 2019-06-03 NOTE — Addendum Note (Signed)
Addended by: Lucille Passy on: 06/03/2019 06:17 PM   Modules accepted: Orders

## 2019-06-03 NOTE — Patient Instructions (Signed)
Great to see you. I will call you with your lab results from today and you can view them online.   Please call the breast center at [phone] to schedule your bone density on the same day as your mammogram, which 07/15/19.

## 2019-06-03 NOTE — Assessment & Plan Note (Signed)
Has been controlled on zocor- no changes made today but she is due for labs today.

## 2019-06-03 NOTE — Assessment & Plan Note (Signed)
Well controlled on current rxs. Due for labs today. Orders Placed This Encounter  Procedures  . CBC with Differential/Platelet  . Comprehensive metabolic panel  . Lipid panel  . TSH  . PTH, Intact and Calcium  . Vitamin D (25 hydroxy)

## 2019-06-03 NOTE — Assessment & Plan Note (Signed)
Takes a calcium/magneius/vit D supplement. Repeat Vit D today.

## 2019-06-05 ENCOUNTER — Other Ambulatory Visit: Payer: Self-pay

## 2019-06-05 ENCOUNTER — Ambulatory Visit
Admission: RE | Admit: 2019-06-05 | Discharge: 2019-06-05 | Disposition: A | Discharge status: Home / Self Care | Payer: Medicare Other | Source: Ambulatory Visit | Attending: Family Medicine | Admitting: Family Medicine

## 2019-06-05 DIAGNOSIS — M8589 Other specified disorders of bone density and structure, multiple sites: Secondary | ICD-10-CM | POA: Diagnosis not present

## 2019-06-05 DIAGNOSIS — Z780 Asymptomatic menopausal state: Secondary | ICD-10-CM | POA: Diagnosis not present

## 2019-06-05 DIAGNOSIS — M85869 Other specified disorders of bone density and structure, unspecified lower leg: Secondary | ICD-10-CM

## 2019-06-06 ENCOUNTER — Encounter: Payer: Self-pay | Admitting: Family Medicine

## 2019-06-12 ENCOUNTER — Other Ambulatory Visit: Payer: Self-pay | Admitting: Family Medicine

## 2019-06-17 ENCOUNTER — Other Ambulatory Visit: Payer: Self-pay | Admitting: Family Medicine

## 2019-06-19 DIAGNOSIS — D2272 Melanocytic nevi of left lower limb, including hip: Secondary | ICD-10-CM | POA: Diagnosis not present

## 2019-06-19 DIAGNOSIS — L821 Other seborrheic keratosis: Secondary | ICD-10-CM | POA: Diagnosis not present

## 2019-06-19 DIAGNOSIS — Z85828 Personal history of other malignant neoplasm of skin: Secondary | ICD-10-CM | POA: Diagnosis not present

## 2019-06-19 DIAGNOSIS — Z08 Encounter for follow-up examination after completed treatment for malignant neoplasm: Secondary | ICD-10-CM | POA: Diagnosis not present

## 2019-06-19 DIAGNOSIS — D2261 Melanocytic nevi of right upper limb, including shoulder: Secondary | ICD-10-CM | POA: Diagnosis not present

## 2019-06-19 DIAGNOSIS — D225 Melanocytic nevi of trunk: Secondary | ICD-10-CM | POA: Diagnosis not present

## 2019-06-19 DIAGNOSIS — D2262 Melanocytic nevi of left upper limb, including shoulder: Secondary | ICD-10-CM | POA: Diagnosis not present

## 2019-06-19 DIAGNOSIS — D2271 Melanocytic nevi of right lower limb, including hip: Secondary | ICD-10-CM | POA: Diagnosis not present

## 2019-07-03 ENCOUNTER — Other Ambulatory Visit: Payer: Self-pay | Admitting: Family Medicine

## 2019-07-15 ENCOUNTER — Ambulatory Visit
Admission: RE | Admit: 2019-07-15 | Discharge: 2019-07-15 | Disposition: A | Discharge status: Home / Self Care | Payer: Medicare Other | Source: Ambulatory Visit | Attending: Family Medicine | Admitting: Family Medicine

## 2019-07-15 ENCOUNTER — Other Ambulatory Visit: Payer: Self-pay

## 2019-07-15 DIAGNOSIS — Z1231 Encounter for screening mammogram for malignant neoplasm of breast: Secondary | ICD-10-CM

## 2019-10-18 ENCOUNTER — Other Ambulatory Visit: Payer: Self-pay

## 2019-10-18 MED ORDER — QUINAPRIL HCL 5 MG PO TABS: 5.0000 mg | ORAL_TABLET | Freq: Every day | ORAL | 0 refills | Status: DC

## 2019-10-18 NOTE — Telephone Encounter (Signed)
Last OV 06/03/19 Last fill 06/17/19  #90/0

## 2019-10-24 ENCOUNTER — Telehealth: Payer: Self-pay | Admitting: Family Medicine

## 2019-10-24 NOTE — Telephone Encounter (Signed)
Pt would like to transfer to Dr Ethelene Hal since Dr Deborra Medina is leaving

## 2019-10-24 NOTE — Telephone Encounter (Signed)
That would be okay, [place].

## 2019-10-25 NOTE — Telephone Encounter (Signed)
Pt said she will call back when she is ready to schedule.

## 2019-11-17 ENCOUNTER — Ambulatory Visit: Payer: Medicare Other

## 2019-11-17 ENCOUNTER — Ambulatory Visit: Payer: Medicare Other | Attending: Internal Medicine

## 2019-11-17 DIAGNOSIS — Z23 Encounter for immunization: Secondary | ICD-10-CM | POA: Insufficient documentation

## 2019-11-17 NOTE — Progress Notes (Signed)
   Covid-19 Vaccination Clinic  Name:  CLISTA MERCY    MRN: [id number] DOB: [birth year]  11/17/2019  Ms. Loseke was observed post Covid-19 immunization for 15 minutes without incidence. She was provided with Vaccine Information Sheet and instruction to access the V-Safe system.   Ms. Schoonmaker was instructed to call 911 with any severe reactions post vaccine: Marland Kitchen Difficulty breathing  . Swelling of your face and throat  . A fast heartbeat  . A bad rash all over your body  . Dizziness and weakness    Immunizations Administered    Name Date Dose VIS Date Route   Pfizer COVID-19 Vaccine 11/17/2019 12:51 PM 0.3 mL 09/13/2019 Intramuscular   Manufacturer: [place]   Lot: [id number]   [place]: [id number]

## 2019-12-04 ENCOUNTER — Other Ambulatory Visit: Payer: Self-pay

## 2019-12-04 MED ORDER — AMLODIPINE BESYLATE 5 MG PO TABS: 5.0000 mg | ORAL_TABLET | Freq: Every day | ORAL | 0 refills | Status: DC

## 2019-12-10 ENCOUNTER — Ambulatory Visit: Payer: Medicare Other | Attending: Internal Medicine

## 2019-12-10 DIAGNOSIS — Z23 Encounter for immunization: Secondary | ICD-10-CM | POA: Insufficient documentation

## 2019-12-10 NOTE — Progress Notes (Signed)
   Covid-19 Vaccination Clinic  Name:  Heidi Sanchez    MRN: [id number] DOB: 04/24/1941  12/10/2019  [name] was observed post Covid-19 immunization for 15 minutes without incident. She was provided with Vaccine Information Sheet and instruction to access the V-Safe system.   [name] was instructed to call 911 with any severe reactions post vaccine: Marland Kitchen Difficulty breathing  . Swelling of face and throat  . A fast heartbeat  . A bad rash all over body  . Dizziness and weakness   Immunizations Administered    Name Date Dose VIS Date Route   Pfizer COVID-19 Vaccine 12/10/2019  5:42 PM 0.3 mL 09/13/2019 Intramuscular   Manufacturer: [place]   Lot: [id number]   [place]: [id number]

## 2019-12-25 ENCOUNTER — Other Ambulatory Visit: Payer: Self-pay

## 2019-12-26 ENCOUNTER — Encounter: Payer: Self-pay | Admitting: Family Medicine

## 2019-12-26 ENCOUNTER — Ambulatory Visit (INDEPENDENT_AMBULATORY_CARE_PROVIDER_SITE_OTHER): Payer: Medicare Other | Admitting: Family Medicine

## 2019-12-26 VITALS — BP 134/70 | HR 76 | Temp 97.7°F | Wt 153.4 lb

## 2019-12-26 DIAGNOSIS — M8580 Other specified disorders of bone density and structure, unspecified site: Secondary | ICD-10-CM

## 2019-12-26 DIAGNOSIS — E559 Vitamin D deficiency, unspecified: Secondary | ICD-10-CM

## 2019-12-26 DIAGNOSIS — E7800 Pure hypercholesterolemia, unspecified: Secondary | ICD-10-CM

## 2019-12-26 DIAGNOSIS — I10 Essential (primary) hypertension: Secondary | ICD-10-CM | POA: Diagnosis not present

## 2019-12-26 DIAGNOSIS — H547 Unspecified visual loss: Secondary | ICD-10-CM

## 2019-12-26 LAB — COMPREHENSIVE METABOLIC PANEL
ALT: 23 U/L (ref 0–35)
AST: 19 U/L (ref 0–37)
Albumin: 4.2 g/dL (ref 3.5–5.2)
Alkaline Phosphatase: 57 U/L (ref 39–117)
BUN: 13 mg/dL (ref 6–23)
CO2: 29 mEq/L (ref 19–32)
Calcium: 10.3 mg/dL (ref 8.4–10.5)
Chloride: 107 mEq/L (ref 96–112)
Creatinine, Ser: 0.78 mg/dL (ref 0.40–1.20)
GFR: 70.85 mL/min (ref >60.00)
Glucose, Bld: 89 mg/dL (ref 70–99)
Potassium: 4.6 mEq/L (ref 3.5–5.1)
Sodium: 141 mEq/L (ref 135–145)
Total Bilirubin: 0.5 mg/dL (ref 0.2–1.2)
Total Protein: 6.7 g/dL (ref 6.0–8.3)

## 2019-12-26 LAB — CBC
HCT: 39.2 % (ref 36.0–46.0)
Hemoglobin: 13.3 g/dL (ref 12.0–15.0)
MCHC: 33.9 g/dL (ref 30.0–36.0)
MCV: 95.7 fl (ref 78.0–100.0)
Platelets: 229 10*3/uL (ref 150.0–400.0)
RBC: 4.09 Mil/uL (ref 3.87–5.11)
RDW: 13.1 % (ref 11.5–15.5)
WBC: 6.7 10*3/uL (ref 4.0–10.5)

## 2019-12-26 LAB — LIPID PANEL
Cholesterol: 125 mg/dL (ref 0–200)
HDL: 25 mg/dL — ABNORMAL LOW (ref >39.00)
LDL Cholesterol: 62 mg/dL (ref 0–99)
NonHDL: 100.06
Total CHOL/HDL Ratio: 5
Triglycerides: 190 mg/dL — ABNORMAL HIGH (ref 0.0–149.0)
VLDL: 38 mg/dL (ref 0.0–40.0)

## 2019-12-26 LAB — VITAMIN D 25 HYDROXY (VIT D DEFICIENCY, FRACTURES): VITD: 34.82 ng/mL (ref 30.00–100.00)

## 2019-12-26 LAB — LDL CHOLESTEROL, DIRECT: Direct LDL: 79 mg/dL

## 2019-12-26 NOTE — Progress Notes (Signed)
Established Patient Office Visit  Subjective:  Patient ID: Heidi Sanchez, female    DOB: 04/06/1941  Age: 79 y.o. MRN: [id number]  CC:  Chief Complaint  Patient presents with  . Follow-up    6 month follow up on BP and ostopenia, no concerns.     HPI Heidi Sanchez presents for follow-up of her hypertension, elevated cholesterol, osteopenia, vitamin D deficiency.  Blood pressures well controlled with her Norvasc and Accupril.  She is taking simvastatin for her cholesterol.  Currently taking 2000 international units of vitamin D.  She is not taking calcium at this time status post recent hypercalcemia.  She does have a history of osteopenia from a bone scan in 2020.  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  She has regular follow-up with her ophthalmologist.  Past Medical History:  Diagnosis Date  . Breast cancer ([place])   . Cancer [facility])    breast  . Colonic polyp   . Hyperlipidemia   . Hypertension   . Personal history of radiation therapy     Past Surgical History:  Procedure Laterality Date  . BREAST BIOPSY    . BREAST LUMPECTOMY Right    1998    Family History  Problem Relation Age of Onset  . Cancer Mother   . Cancer Father     Social History   Socioeconomic History  . Marital status: Married    Spouse name: Not on file  . Number of children: Not on file  . Years of education: Not on file  . Highest education level: Not on file  Occupational History  . Not on file  Tobacco Use  . Smoking status: Never Smoker  . Smokeless tobacco: Never Used  Substance and Sexual Activity  . Alcohol use: No  . Drug use: No  . Sexual activity: Not on file  Other Topics Concern  . Not on file  Social History Narrative   HPOA is her husband, Christany Beller.     She would want CPR- no feeding tube or "heroic measures."   Social Determinants of Health   Financial Resource Strain:   . Difficulty of Paying Living Expenses:   Food Insecurity:     . Worried About Charity fundraiser in the Last Year:   . Arboriculturist in the Last Year:   Transportation Needs:   . Film/video editor (Medical):   Marland Kitchen Lack of Transportation (Non-Medical):   Physical Activity:   . Days of Exercise per Week:   . Minutes of Exercise per Session:   Stress:   . Feeling of Stress :   Social Connections:   . Frequency of Communication with Friends and Family:   . Frequency of Social Gatherings with Friends and Family:   . Attends Religious Services:   . Active Member of Clubs or Organizations:   . Attends Archivist Meetings:   Marland Kitchen Marital Status:   Intimate Partner Violence:   . Fear of Current or Ex-Partner:   . Emotionally Abused:   Marland Kitchen Physically Abused:   . Sexually Abused:     Outpatient Medications Prior to Visit  Medication Sig Dispense Refill  . amLODipine (NORVASC) 5 MG tablet Take 1 tablet (5 mg total) by mouth daily. Plz sched visit with new provider 90 tablet 0  . Cholecalciferol (VITAMIN D) 50 MCG (2000 UT) CAPS Take 2,000 Units by mouth daily.    . diphenhydrAMINE (BENADRYL) 25 MG tablet  Take 25 mg by mouth as needed.    . fluticasone (FLONASE) 50 MCG/ACT nasal spray Place 2 sprays into both nostrils daily. 16 g 6  . Multiple Vitamins-Minerals (OCUVITE PRESERVISION) TABS 1 tablet 2 (two) times daily. Use as directed    . Omega-3 Fatty Acids (FISH OIL) 1000 MG CAPS Take 1 capsule (1,000 mg total) by mouth daily. 90 capsule 3  . quinapril (ACCUPRIL) 5 MG tablet Take 1 tablet (5 mg total) by mouth at bedtime. 90 tablet 0  . simvastatin (ZOCOR) 40 MG tablet TAKE 1 TABLET BY MOUTH EVERYDAY AT BEDTIME 90 tablet 2   No facility-administered medications prior to visit.    Allergies  Allergen Reactions  . Codeine     REACTION: Nigthmares    ROS Review of Systems  Constitutional: Negative.   HENT: Negative.   Eyes: Positive for visual disturbance. Negative for photophobia.  Respiratory: Negative.   Cardiovascular:  Negative.   Gastrointestinal: Negative.   Genitourinary: Negative.   Musculoskeletal: Negative for joint swelling.  Neurological: Negative.   Hematological: Negative.   Psychiatric/Behavioral: Negative.       Objective:    Physical Exam  Constitutional: She is oriented to person, place, and time. She appears well-developed and well-nourished. No distress.  HENT:  Head: Normocephalic and atraumatic.  Right Ear: External ear normal.  Left Ear: External ear normal.  Eyes: Conjunctivae are normal. Right eye exhibits no discharge. Left eye exhibits no discharge. No scleral icterus.  Neck: No JVD present. No tracheal deviation present.  Cardiovascular: Normal rate, regular rhythm and normal heart sounds.  Pulmonary/Chest: Effort normal and breath sounds normal. No stridor.  Neurological: She is alert and oriented to person, place, and time.  Skin: Skin is warm and dry. She is not diaphoretic.  Psychiatric: She has a normal mood and affect. Her behavior is normal.    BP 134/70   Pulse 76   Temp 97.7 F (36.5 C) (Tympanic)   Wt 153 lb 6.4 oz (69.6 kg)   SpO2 99%   BMI 27.17 kg/m  Wt Readings from Last 3 Encounters:  12/26/19 153 lb 6.4 oz (69.6 kg)  06/03/19 153 lb 12.8 oz (69.8 kg)  04/24/19 152 lb 9.6 oz (69.2 kg)     Health Maintenance Due  Topic Date Due  . TETANUS/TDAP  02/12/2019    There are no preventive care reminders to display for this patient.  Lab Results  Component Value Date   TSH 1.35 06/03/2019   Lab Results  Component Value Date   WBC 7.6 06/03/2019   HGB 13.5 06/03/2019   HCT 40.2 06/03/2019   MCV 97.9 06/03/2019   PLT 233.0 06/03/2019   Lab Results  Component Value Date   NA 140 06/03/2019   K 4.0 06/03/2019   CO2 28 06/03/2019   GLUCOSE 70 06/03/2019   BUN 14 06/03/2019   CREATININE 0.79 06/03/2019   BILITOT 0.4 06/03/2019   ALKPHOS 47 06/03/2019   AST 16 06/03/2019   ALT 14 06/03/2019   PROT 7.3 06/03/2019   ALBUMIN 4.4  06/03/2019   CALCIUM 10.5 06/03/2019   GFR 69.92 06/03/2019   Lab Results  Component Value Date   CHOL 136 06/03/2019   Lab Results  Component Value Date   HDL 29.20 (L) 06/03/2019   Lab Results  Component Value Date   LDLCALC 81 11/27/2017   Lab Results  Component Value Date   TRIG 244.0 (H) 06/03/2019   Lab Results  Component Value Date  CHOLHDL 5 06/03/2019   No results found for: HGBA1C    Assessment & Plan:   Problem List Items Addressed This Visit      Cardiovascular and Mediastinum   Essential hypertension   Relevant Orders   CBC   Comprehensive metabolic panel     Musculoskeletal and Integument   Osteopenia   Relevant Orders   VITAMIN D 25 Hydroxy (Vit-D Deficiency, Fractures)     Other   Vitamin D deficiency - Primary   Relevant Orders   VITAMIN D 25 Hydroxy (Vit-D Deficiency, Fractures)   Pure hypercholesterolemia   Relevant Orders   Lipid panel   LDL cholesterol, direct   Impaired vision      No orders of the defined types were placed in this encounter.   Follow-up: Return in about 6 months (around 06/27/2020).   She will restart calcium at 600 mg twice daily.  Continue all other medications.  Follow-up in 6 months. Libby Maw, MD

## 2019-12-30 ENCOUNTER — Other Ambulatory Visit: Payer: Self-pay

## 2019-12-30 MED ORDER — AMLODIPINE BESYLATE 5 MG PO TABS: 5.0000 mg | ORAL_TABLET | Freq: Every day | ORAL | 0 refills | Status: DC

## 2020-01-10 ENCOUNTER — Other Ambulatory Visit: Payer: Self-pay

## 2020-01-10 MED ORDER — QUINAPRIL HCL 5 MG PO TABS: 5.0000 mg | ORAL_TABLET | Freq: Every day | ORAL | 1 refills | Status: DC

## 2020-04-03 ENCOUNTER — Other Ambulatory Visit: Payer: Self-pay | Admitting: Family Medicine

## 2020-04-03 NOTE — Telephone Encounter (Signed)
Last OV 12/26/19 Last fill 07/03/19  #90/2 Next OV 06/29/20

## 2020-04-29 ENCOUNTER — Ambulatory Visit (INDEPENDENT_AMBULATORY_CARE_PROVIDER_SITE_OTHER): Payer: Medicare Other

## 2020-04-29 VITALS — Ht 63.0 in | Wt 153.0 lb

## 2020-04-29 DIAGNOSIS — Z0000 Encounter for general adult medical examination without abnormal findings: Secondary | ICD-10-CM

## 2020-04-29 NOTE — Progress Notes (Signed)
Subjective:   Heidi Sanchez is a 79 y.o. female who presents for Medicare Annual (Subsequent) preventive examination.  I connected with Heidi Sanchez today by telephone and verified that I am speaking with the correct person using two identifiers. Location patient: home Location provider: work Persons participating in the virtual visit: patient, Marine scientist.    I discussed the limitations, risks, security and privacy concerns of performing an evaluation and management service by telephone and the availability of in person appointments. I also discussed with the patient that there may be a patient responsible charge related to this service. The patient expressed understanding and verbally consented to this telephonic visit.    Interactive audio and video telecommunications were attempted between this provider and patient, however failed, due to patient having technical difficulties OR patient did not have access to video capability.  We continued and completed visit with audio only.  Some vital signs may be absent or patient reported.   Time Spent with patient on telephone encounter: 20  minutes.  Review of Systems     Cardiac Risk Factors include: advanced age (>44men, >41 women);dyslipidemia;hypertension     Objective:    Today's Vitals   04/29/20 0919  Weight: 153 lb (69.4 kg)  Height: 5\' 3"  (1.6 m)   Body mass index is 27.1 kg/m.  Advanced Directives 04/29/2020 04/24/2019 09/15/2016  Does Patient Have a Medical Advance Directive? Yes Yes Yes  Type of Paramedic of [place];Living will [place];Living will [place];Living will  Does patient want to make changes to medical advance directive? - No - Patient declined -  Copy of [place] in Chart? No - copy requested No - copy requested No - copy requested    Current Medications (verified) Outpatient Encounter Medications as of 04/29/2020  Medication Sig  .  amLODipine (NORVASC) 5 MG tablet Take 1 tablet (5 mg total) by mouth daily. Plz sched visit with new provider  . Cholecalciferol (VITAMIN D) 50 MCG (2000 UT) CAPS Take 2,000 Units by mouth daily.  . diphenhydrAMINE (BENADRYL) 25 MG tablet Take 25 mg by mouth as needed.  . fluticasone (FLONASE) 50 MCG/ACT nasal spray Place 2 sprays into both nostrils daily.  . Multiple Vitamins-Minerals (OCUVITE PRESERVISION) TABS 1 tablet 2 (two) times daily. Use as directed  . Omega-3 Fatty Acids (FISH OIL) 1000 MG CAPS Take 1 capsule (1,000 mg total) by mouth daily.  . quinapril (ACCUPRIL) 5 MG tablet Take 1 tablet (5 mg total) by mouth at bedtime.  . simvastatin (ZOCOR) 40 MG tablet TAKE 1 TABLET BY MOUTH EVERYDAY AT BEDTIME   No facility-administered encounter medications on file as of 04/29/2020.    Allergies (verified) Codeine   History: Past Medical History:  Diagnosis Date  . Breast cancer ([place])   . Cancer [facility])    breast  . Colonic polyp   . Hyperlipidemia   . Hypertension   . Personal history of radiation therapy    Past Surgical History:  Procedure Laterality Date  . BREAST BIOPSY    . BREAST LUMPECTOMY Right    1998   Family History  Problem Relation Age of Onset  . Cancer Mother   . Cancer Father    Social History   Socioeconomic History  . Marital status: Married    Spouse name: Not on file  . Number of children: Not on file  . Years of education: Not on file  . Highest education level: Not on  file  Occupational History  . Not on file  Tobacco Use  . Smoking status: Never Smoker  . Smokeless tobacco: Never Used  Substance and Sexual Activity  . Alcohol use: No  . Drug use: No  . Sexual activity: Not on file  Other Topics Concern  . Not on file  Social History Narrative   HPOA is her husband, Heidi Sanchez.     She would want CPR- no feeding tube or "heroic measures."   Social Determinants of Health   Financial Resource Strain: Low Risk   . Difficulty of  Paying Living Expenses: Not hard at all  Food Insecurity: No Food Insecurity  . Worried About Charity fundraiser in the Last Year: Never true  . Ran Out of Food in the Last Year: Never true  Transportation Needs: No Transportation Needs  . Lack of Transportation (Medical): No  . Lack of Transportation (Non-Medical): No  Physical Activity: Sufficiently Active  . Days of Exercise per Week: 6 days  . Minutes of Exercise per Session: 40 min  Stress: No Stress Concern Present  . Feeling of Stress : Not at all  Social Connections: Socially Integrated  . Frequency of Communication with Friends and Family: More than three times a week  . Frequency of Social Gatherings with Friends and Family: More than three times a week  . Attends Religious Services: More than 4 times per year  . Active Member of Clubs or Organizations: Yes  . Attends Archivist Meetings: More than 4 times per year  . Marital Status: Married    Tobacco Counseling Counseling given: Not Answered   Clinical Intake:  Pre-visit preparation completed: Yes  Pain : No/denies pain     Nutritional Status: BMI 25 -29 Overweight Nutritional Risks: None Diabetes: No  How often do you need to have someone help you when you read instructions, pamphlets, or other written materials from your doctor or pharmacy?: 1 - Never What is the last grade level you completed in school?: High School Graduate  Diabetic?No  Interpreter Needed?: No  Information entered by :: Heidi Hamman LPN   Activities of Daily Living In your present state of health, do you have any difficulty performing the following activities: 04/29/2020  Hearing? N  Vision? Y  Difficulty concentrating or making decisions? N  Walking or climbing stairs? N  Dressing or bathing? N  Doing errands, shopping? N  Preparing Food and eating ? N  Using the Toilet? N  In the past six months, have you accidently leaked urine? N  Do you have problems with  loss of bowel control? N  Managing your Medications? N  Managing your Finances? N  Housekeeping or managing your Housekeeping? N  Some recent data might be hidden    Patient Care Team: Libby Maw, MD as PCP - General (Family Medicine) Oneta Rack, MD as Consulting Physician (Dermatology) Leandrew Koyanagi, MD as Referring Physician (Ophthalmology) Vernon Prey, MD as Referring Physician (Ophthalmology) Boykin Nearing, DDS as Consulting Physician (Dentistry)  Indicate any recent Medical Services you may have received from other than [facility] providers in the past year (date may be approximate).     Assessment:   This is a routine wellness examination for Ramona.  Hearing/Vision screen  Hearing Screening   125Hz  250Hz  500Hz  1000Hz  2000Hz  3000Hz  4000Hz  6000Hz  8000Hz   Right ear:           Left ear:  Comments: No issues  Vision Screening Comments: Last eye exam-03/31/2020 [facility]  Dietary issues and exercise activities discussed: Current Exercise Habits: Home exercise routine, Type of exercise: walking, Time (Minutes): 40, Frequency (Times/Week): 6, Weekly Exercise (Minutes/Week): 240, Intensity: Mild  Goals    . Patient Stated     Continue walking regimen      Depression Screen PHQ 2/9 Scores 04/29/2020 04/24/2019 11/28/2017 09/15/2016 09/14/2015 05/26/2014 05/16/2013  PHQ - 2 Score 0 0 0 0 0 0 0    Fall Risk Fall Risk  04/29/2020 12/26/2019 04/24/2019 09/18/2018 11/28/2017  Falls in the past year? 0 0 0 0 No  Comment - - - - -  Number falls in past yr: 0 - - - -  Comment - - - - -  Injury with Fall? 0 - - - -  Follow up Falls prevention discussed - - Falls evaluation completed -    Any stairs in or around the home? Yes  If so, are there any without handrails? No  Home free of loose throw rugs in walkways, pet beds, electrical cords, etc? Yes  Adequate lighting in your home to reduce risk of falls? Yes  ASSISTIVE DEVICES UTILIZED TO  PREVENT FALLS:  Life alert? No  Use of a cane, walker or w/c? No  Grab bars in the bathroom? Yes  Shower chair or bench in shower? Yes  Elevated toilet seat or a handicapped toilet? No   TIMED UP AND GO:  Was the test performed? No . Phone visit   Cognitive Function:No cognitive impairment noted. Patient states she plays freecell, does word search & sews frequently for brain health. MMSE - Mini Mental State Exam 09/15/2016  Orientation to time 5  Orientation to Place 5  Registration 3  Attention/ Calculation 0  Recall 3  Language- name 2 objects 0  Language- repeat 1  Language- follow 3 step command 3  Language- read & follow direction 0  Write a sentence 0  Copy design 0  Total score 20        Immunizations Immunization History  Administered Date(s) Administered  . Fluad Quad(high Dose 65+) 06/03/2019  . Influenza, High Dose Seasonal PF 07/10/2017, 07/04/2018  . Influenza-Unspecified 07/11/2016  . PFIZER SARS-COV-2 Vaccination 11/17/2019, 12/10/2019  . Pneumococcal Conjugate-13 05/26/2014  . Pneumococcal Polysaccharide-23 09/15/2016  . Td 02/11/2009  . Zoster 02/15/2010    TDAP status: Due, Education has been provided regarding the importance of this vaccine. Advised may receive this vaccine at local pharmacy or Health Dept. Aware to provide a copy of the vaccination record if obtained from local pharmacy or Health Dept. Verbalized acceptance and understanding.   Flu Vaccine status: Up to date   Pneumococcal vaccine status: Up to date   Covid-19 vaccine status: Completed vaccines  Qualifies for Shingles Vaccine? Yes   Zostavax completed Yes   Shingrix Completed?: No.    Education has been provided regarding the importance of this vaccine. Patient has been advised to call insurance company to determine out of pocket expense if they have not yet received this vaccine. Advised may also receive vaccine at local pharmacy or Health Dept. Verbalized acceptance and  understanding.  Screening Tests Health Maintenance  Topic Date Due  . TETANUS/TDAP  02/12/2019  . INFLUENZA VACCINE  05/03/2020  . DEXA SCAN  Completed  . COVID-19 Vaccine  Completed  . PNA vac Low Risk Adult  Completed    Health Maintenance  Health Maintenance Due  Topic Date Due  .  TETANUS/TDAP  02/12/2019    Colorectal cancer screening: No longer required.    Mammogram status: Completed 07/15/2019. Repeat every year   Bone Density status: Completed 06/05/2019. Results reflect: Bone density results: OSTEOPENIA. Repeat every 2 years.  Lung Cancer Screening: (Low Dose CT Chest recommended if Age 46-80 years, 30 pack-year currently smoking OR have quit w/in 15years.) does not qualify.     Additional Screening:  Hepatitis C Screening: does not qualify;   Vision Screening: Recommended annual ophthalmology exams for early detection of glaucoma and other disorders of the eye. Is the patient up to date with their annual eye exam?  Yes  Who is the provider or what is the name of the office in which the patient attends annual eye exams? [place] Screening: Recommended annual dental exams for proper oral hygiene  Community Resource Referral / Chronic Care Management: CRR required this visit?  No   CCM required this visit?  No      Plan:     I have personally reviewed and noted the following in the patient's chart:   . Medical and social history . Use of alcohol, tobacco or illicit drugs  . Current medications and supplements . Functional ability and status . Nutritional status . Physical activity . Advanced directives . List of other physicians . Hospitalizations, surgeries, and ER visits in previous 12 months . Vitals . Screenings to include cognitive, depression, and falls . Referrals and appointments  In addition, I have reviewed and discussed with patient certain preventive protocols, quality metrics, and best practice recommendations. A  written personalized care plan for preventive services as well as general preventive health recommendations were provided to patient.    Due to this being a telephonic visit, the after visit summary with patients personalized plan was offered to patient via mail or my-chart. Patient would like to access on my-chart.    Marta Antu, LPN   1/61/[id number]  Nurse Health Advisor  Nurse Notes: None

## 2020-04-29 NOTE — Patient Instructions (Signed)
Heidi Sanchez , Thank you for taking time to complete your Medicare Wellness Visit. I appreciate your ongoing commitment to your health goals. Please review the following plan we discussed and let me know if I can assist you in the future.   Screening recommendations/referrals: Colonoscopy: No longer indicated. Mammogram: Completed 07/15/2019-Due 07/14/2020 Bone Density: Completed 06/05/2019-Due 06/04/2021 Recommended yearly ophthalmology/optometry visit for glaucoma screening and checkup Recommended yearly dental visit for hygiene and checkup  Vaccinations: Influenza vaccine: Up to Date- Due 06/2020 Pneumococcal vaccine: Completed vacicnes Tdap vaccine: Discuss with pharmacy Shingles vaccine: Discuss with pharmacy  Covid-19:Completed vaccines  Advanced directives: Please bring a copy to your next office visit  Conditions/risks identified: See problem list  Next appointment: Follow up in one year for your annual wellness visit    Preventive Care 65 Years and Older, Female Preventive care refers to lifestyle choices and visits with your health care provider that can promote health and wellness. What does preventive care include?  A yearly physical exam. This is also called an annual well check.  Dental exams once or twice a year.  Routine eye exams. Ask your health care provider how often you should have your eyes checked.  Personal lifestyle choices, including:  Daily care of your teeth and gums.  Regular physical activity.  Eating a healthy diet.  Avoiding tobacco and drug use.  Limiting alcohol use.  Practicing safe sex.  Taking low-dose aspirin every day.  Taking vitamin and mineral supplements as recommended by your health care provider. What happens during an annual well check? The services and screenings done by your health care provider during your annual well check will depend on your age, overall health, lifestyle risk factors, and family history of  disease. Counseling  Your health care provider may ask you questions about your:  Alcohol use.  Tobacco use.  Drug use.  Emotional well-being.  Home and relationship well-being.  Sexual activity.  Eating habits.  History of falls.  Memory and ability to understand (cognition).  Work and work Statistician.  Reproductive health. Screening  You may have the following tests or measurements:  Height, weight, and BMI.  Blood pressure.  Lipid and cholesterol levels. These may be checked every 5 years, or more frequently if you are over 85 years old.  Skin check.  Lung cancer screening. You may have this screening every year starting at age 45 if you have a 30-pack-year history of smoking and currently smoke or have quit within the past 15 years.  Fecal occult blood test (FOBT) of the stool. You may have this test every year starting at age 5.  Flexible sigmoidoscopy or colonoscopy. You may have a sigmoidoscopy every 5 years or a colonoscopy every 10 years starting at age 69.  Hepatitis C blood test.  Hepatitis B blood test.  Sexually transmitted disease (STD) testing.  Diabetes screening. This is done by checking your blood sugar (glucose) after you have not eaten for a while (fasting). You may have this done every 1-3 years.  Bone density scan. This is done to screen for osteoporosis. You may have this done starting at age 40.  Mammogram. This may be done every 1-2 years. Talk to your health care provider about how often you should have regular mammograms. Talk with your health care provider about your test results, treatment options, and if necessary, the need for more tests. Vaccines  Your health care provider may recommend certain vaccines, such as:  Influenza vaccine. This is recommended every year.  Tetanus, diphtheria, and acellular pertussis (Tdap, Td) vaccine. You may need a Td booster every 10 years.  Zoster vaccine. You may need this after age  4.  Pneumococcal 13-valent conjugate (PCV13) vaccine. One dose is recommended after age 106.  Pneumococcal polysaccharide (PPSV23) vaccine. One dose is recommended after age 30. Talk to your health care provider about which screenings and vaccines you need and how often you need them. This information is not intended to replace advice given to you by your health care provider. Make sure you discuss any questions you have with your health care provider. Document Released: 10/16/2015 Document Revised: 06/08/2016 Document Reviewed: 07/21/2015 Elsevier Interactive Patient Education  2017 [employer] Prevention in the Home Falls can cause injuries. They can happen to people of all ages. There are many things you can do to make your home safe and to help prevent falls. What can I do on the outside of my home?  Regularly fix the edges of walkways and driveways and fix any cracks.  Remove anything that might make you trip as you walk through a door, such as a raised step or threshold.  Trim any bushes or trees on the path to your home.  Use bright outdoor lighting.  Clear any walking paths of anything that might make someone trip, such as rocks or tools.  Regularly check to see if handrails are loose or broken. Make sure that both sides of any steps have handrails.  Any raised decks and porches should have guardrails on the edges.  Have any leaves, snow, or ice cleared regularly.  Use sand or salt on walking paths during winter.  Clean up any spills in your garage right away. This includes oil or grease spills. What can I do in the bathroom?  Use night lights.  Install grab bars by the toilet and in the tub and shower. Do not use towel bars as grab bars.  Use non-skid mats or decals in the tub or shower.  If you need to sit down in the shower, use a plastic, non-slip stool.  Keep the floor dry. Clean up any water that spills on the floor as soon as it happens.  Remove  soap buildup in the tub or shower regularly.  Attach bath mats securely with double-sided non-slip rug tape.  Do not have throw rugs and other things on the floor that can make you trip. What can I do in the bedroom?  Use night lights.  Make sure that you have a light by your bed that is easy to reach.  Do not use any [place] or blankets that are too big for your bed. They should not hang down onto the floor.  Have a firm chair that has side arms. You can use this for support while you get dressed.  Do not have throw rugs and other things on the floor that can make you trip. What can I do in the kitchen?  Clean up any spills right away.  Avoid walking on wet floors.  Keep items that you use a lot in easy-to-reach places.  If you need to reach something above you, use a strong step stool that has a grab bar.  Keep electrical cords out of the way.  Do not use floor polish or wax that makes floors slippery. If you must use wax, use non-skid floor wax.  Do not have throw rugs and other things on the floor that can make you trip. What can I do  with my stairs?  Do not leave any items on the stairs.  Make sure that there are handrails on both sides of the stairs and use them. Fix handrails that are broken or loose. Make sure that handrails are as long as the stairways.  Check any carpeting to make sure that it is firmly attached to the stairs. Fix any carpet that is loose or worn.  Avoid having throw rugs at the top or bottom of the stairs. If you do have throw rugs, attach them to the floor with carpet tape.  Make sure that you have a light switch at the top of the stairs and the bottom of the stairs. If you do not have them, ask someone to add them for you. What else can I do to help prevent falls?  Wear shoes that:  Do not have high heels.  Have rubber bottoms.  Are comfortable and fit you well.  Are closed at the toe. Do not wear sandals.  If you use a  stepladder:  Make sure that it is fully opened. Do not climb a closed stepladder.  Make sure that both sides of the stepladder are locked into place.  Ask someone to hold it for you, if possible.  Clearly mark and make sure that you can see:  Any grab bars or handrails.  First and last steps.  Where the edge of each step is.  Use tools that help you move around (mobility aids) if they are needed. These include:  Canes.  Walkers.  Scooters.  Crutches.  Turn on the lights when you go into a dark area. Replace any light bulbs as soon as they burn out.  Set up your furniture so you have a clear path. Avoid moving your furniture around.  If any of your floors are uneven, fix them.  If there are any pets around you, be aware of where they are.  Review your medicines with your doctor. Some medicines can make you feel dizzy. This can increase your chance of falling. Ask your doctor what other things that you can do to help prevent falls. This information is not intended to replace advice given to you by your health care provider. Make sure you discuss any questions you have with your health care provider. Document Released: 07/16/2009 Document Revised: 02/25/2016 Document Reviewed: 10/24/2014 Elsevier Interactive Patient Education  2017 [employer].

## 2020-06-15 ENCOUNTER — Other Ambulatory Visit: Payer: Self-pay

## 2020-06-15 MED ORDER — FLUTICASONE PROPIONATE 50 MCG/ACT NA SUSP: 2.0000 | Freq: Every day | NASAL | 6 refills | Status: DC

## 2020-06-17 DIAGNOSIS — D2262 Melanocytic nevi of left upper limb, including shoulder: Secondary | ICD-10-CM | POA: Diagnosis not present

## 2020-06-17 DIAGNOSIS — D225 Melanocytic nevi of trunk: Secondary | ICD-10-CM | POA: Diagnosis not present

## 2020-06-17 DIAGNOSIS — D2272 Melanocytic nevi of left lower limb, including hip: Secondary | ICD-10-CM | POA: Diagnosis not present

## 2020-06-17 DIAGNOSIS — Z85828 Personal history of other malignant neoplasm of skin: Secondary | ICD-10-CM | POA: Diagnosis not present

## 2020-06-17 DIAGNOSIS — L821 Other seborrheic keratosis: Secondary | ICD-10-CM | POA: Diagnosis not present

## 2020-06-17 DIAGNOSIS — D2261 Melanocytic nevi of right upper limb, including shoulder: Secondary | ICD-10-CM | POA: Diagnosis not present

## 2020-06-21 ENCOUNTER — Other Ambulatory Visit: Payer: Self-pay | Admitting: Family Medicine

## 2020-06-29 ENCOUNTER — Other Ambulatory Visit: Payer: Self-pay

## 2020-06-29 ENCOUNTER — Encounter: Payer: Self-pay | Admitting: Family Medicine

## 2020-06-29 ENCOUNTER — Ambulatory Visit (INDEPENDENT_AMBULATORY_CARE_PROVIDER_SITE_OTHER): Payer: Medicare Other | Admitting: Family Medicine

## 2020-06-29 VITALS — BP 150/72 | HR 84 | Temp 97.4°F | Ht 63.0 in | Wt 154.6 lb

## 2020-06-29 DIAGNOSIS — E559 Vitamin D deficiency, unspecified: Secondary | ICD-10-CM | POA: Diagnosis not present

## 2020-06-29 DIAGNOSIS — Z1231 Encounter for screening mammogram for malignant neoplasm of breast: Secondary | ICD-10-CM | POA: Diagnosis not present

## 2020-06-29 DIAGNOSIS — Z853 Personal history of malignant neoplasm of breast: Secondary | ICD-10-CM | POA: Diagnosis not present

## 2020-06-29 DIAGNOSIS — I10 Essential (primary) hypertension: Secondary | ICD-10-CM

## 2020-06-29 DIAGNOSIS — Z23 Encounter for immunization: Secondary | ICD-10-CM | POA: Diagnosis not present

## 2020-06-29 LAB — BASIC METABOLIC PANEL
BUN: 11 mg/dL (ref 6–23)
CO2: 28 mEq/L (ref 19–32)
Calcium: 10.2 mg/dL (ref 8.4–10.5)
Chloride: 107 mEq/L (ref 96–112)
Creatinine, Ser: 0.78 mg/dL (ref 0.40–1.20)
GFR: 70.76 mL/min (ref >60.00)
Glucose, Bld: 110 mg/dL — ABNORMAL HIGH (ref 70–99)
Potassium: 3.9 mEq/L (ref 3.5–5.1)
Sodium: 143 mEq/L (ref 135–145)

## 2020-06-29 LAB — VITAMIN D 25 HYDROXY (VIT D DEFICIENCY, FRACTURES): VITD: 28.65 ng/mL — ABNORMAL LOW (ref 30.00–100.00)

## 2020-06-29 NOTE — Progress Notes (Signed)
Established Patient Office Visit  Subjective:  Patient ID: Heidi Sanchez, female    DOB: Jul 17, 1941  Age: 79 y.o. MRN: [id number]  CC:  Chief Complaint  Patient presents with  . Follow-up    6 months follow up, should she have mammagram at her age, is Vitamin D dosage to high for her.     HPI Heidi Sanchez presents for follow-up for vitamin D deficiency, history of breast cancer and need for flu vaccine.  Requests opinion on Covid booster.  Second Covid vaccine with [employer] was in March.  History of right breast cancer in 1998 treated with lumpectomy, lymph node dissection and follow-up radiation therapy to her knowledge she has not had a recurrence.  She would like a follow-up mammogram.  Blood pressure is well controlled at home with amlodipine and Accupril.  Blood pressures at home are running in the 130/70 range.  She admits that she gets aggravated after traveling in the car to get to the clinic.  Past Medical History:  Diagnosis Date  . Breast cancer ([place])   . Cancer [facility])    breast  . Colonic polyp   . Hyperlipidemia   . Hypertension   . Personal history of radiation therapy     Past Surgical History:  Procedure Laterality Date  . BREAST BIOPSY    . BREAST LUMPECTOMY Right    1998    Family History  Problem Relation Age of Onset  . Cancer Mother   . Cancer Father     Social History   Socioeconomic History  . Marital status: Married    Spouse name: Not on file  . Number of children: Not on file  . Years of education: Not on file  . Highest education level: Not on file  Occupational History  . Not on file  Tobacco Use  . Smoking status: Never Smoker  . Smokeless tobacco: Never Used  Substance and Sexual Activity  . Alcohol use: No  . Drug use: No  . Sexual activity: Not on file  Other Topics Concern  . Not on file  Social History Narrative   HPOA is her husband, Heidi Sanchez.     She would want CPR- no feeding tube or "heroic measures."   Social  Determinants of Health   Financial Resource Strain: Low Risk   . Difficulty of Paying Living Expenses: Not hard at all  Food Insecurity: No Food Insecurity  . Worried About Charity fundraiser in the Last Year: Never true  . Ran Out of Food in the Last Year: Never true  Transportation Needs: No Transportation Needs  . Lack of Transportation (Medical): No  . Lack of Transportation (Non-Medical): No  Physical Activity: Sufficiently Active  . Days of Exercise per Week: 6 days  . Minutes of Exercise per Session: 40 min  Stress: No Stress Concern Present  . Feeling of Stress : Not at all  Social Connections: Socially Integrated  . Frequency of Communication with Friends and Family: More than three times a week  . Frequency of Social Gatherings with Friends and Family: More than three times a week  . Attends Religious Services: More than 4 times per year  . Active Member of Clubs or Organizations: Yes  . Attends Archivist Meetings: More than 4 times per year  . Marital Status: Married  Human resources officer Violence: Not At Risk  . Fear of Current or Ex-Partner: No  . Emotionally Abused: No  . Physically Abused:  No  . Sexually Abused: No    Outpatient Medications Prior to Visit  Medication Sig Dispense Refill  . amLODipine (NORVASC) 5 MG tablet TAKE 1 TABLET (5 MG TOTAL) BY MOUTH DAILY. PLZ SCHED VISIT WITH NEW PROVIDER 90 tablet 0  . Cholecalciferol (VITAMIN D) 50 MCG (2000 UT) CAPS Take 2,000 Units by mouth daily.    . diphenhydrAMINE (BENADRYL) 25 MG tablet Take 25 mg by mouth as needed.    . fluticasone (FLONASE) 50 MCG/ACT nasal spray Place 2 sprays into both nostrils daily. 16 g 6  . Multiple Vitamins-Minerals (OCUVITE PRESERVISION) TABS 1 tablet 2 (two) times daily. Use as directed    . Omega-3 Fatty Acids (FISH OIL) 1000 MG CAPS Take 1 capsule (1,000 mg total) by mouth daily. 90 capsule 3  . quinapril (ACCUPRIL) 5 MG tablet Take 1 tablet (5 mg total) by mouth at bedtime.  90 tablet 1  . simvastatin (ZOCOR) 40 MG tablet TAKE 1 TABLET BY MOUTH EVERYDAY AT BEDTIME 90 tablet 0   No facility-administered medications prior to visit.    Allergies  Allergen Reactions  . Codeine     REACTION: Nigthmares    ROS Review of Systems  Constitutional: Negative.   Eyes: Positive for visual disturbance.  Respiratory: Negative.   Cardiovascular: Negative.   Gastrointestinal: Negative.   Genitourinary: Negative.   Neurological: Negative.   Psychiatric/Behavioral: Negative.       Objective:    Physical Exam Vitals and nursing note reviewed.  Constitutional:      General: She is not in acute distress.    Appearance: Normal appearance. She is normal weight. She is not ill-appearing, toxic-appearing or diaphoretic.  HENT:     Head: Normocephalic and atraumatic.     Right Ear: External ear normal.     Left Ear: External ear normal.  Eyes:     General: No scleral icterus.       Left eye: No discharge.     Conjunctiva/sclera: Conjunctivae normal.  Cardiovascular:     Rate and Rhythm: Normal rate and regular rhythm.  Pulmonary:     Effort: Pulmonary effort is normal.     Breath sounds: Normal breath sounds.  Musculoskeletal:     Cervical back: No rigidity or tenderness.  Lymphadenopathy:     Cervical: No cervical adenopathy.  Skin:    General: Skin is warm and dry.  Neurological:     Mental Status: She is alert and oriented to person, place, and time.  Psychiatric:        Mood and Affect: Mood normal.        Behavior: Behavior normal.     BP (!) 150/72   Pulse 84   Temp (!) 97.4 F (36.3 C) (Tympanic)   Ht 5\' 3"  (1.6 m)   Wt 154 lb 9.6 oz (70.1 kg)   SpO2 98%   BMI 27.39 kg/m  Wt Readings from Last 3 Encounters:  06/29/20 154 lb 9.6 oz (70.1 kg)  04/29/20 153 lb (69.4 kg)  12/26/19 153 lb 6.4 oz (69.6 kg)     Health Maintenance Due  Topic Date Due  . TETANUS/TDAP  02/12/2019    There are no preventive care reminders to display for  this patient.  Lab Results  Component Value Date   TSH 1.35 06/03/2019   Lab Results  Component Value Date   WBC 6.7 12/26/2019   HGB 13.3 12/26/2019   HCT 39.2 12/26/2019   MCV 95.7 12/26/2019   PLT 229.0  12/26/2019   Lab Results  Component Value Date   NA 141 12/26/2019   K 4.6 12/26/2019   CO2 29 12/26/2019   GLUCOSE 89 12/26/2019   BUN 13 12/26/2019   CREATININE 0.78 12/26/2019   BILITOT 0.5 12/26/2019   ALKPHOS 57 12/26/2019   AST 19 12/26/2019   ALT 23 12/26/2019   PROT 6.7 12/26/2019   ALBUMIN 4.2 12/26/2019   CALCIUM 10.3 12/26/2019   GFR 70.85 12/26/2019   Lab Results  Component Value Date   CHOL 125 12/26/2019   Lab Results  Component Value Date   HDL 25.00 (L) 12/26/2019   Lab Results  Component Value Date   LDLCALC 62 12/26/2019   Lab Results  Component Value Date   TRIG 190.0 (H) 12/26/2019   Lab Results  Component Value Date   CHOLHDL 5 12/26/2019   No results found for: HGBA1C    Assessment & Plan:   Problem List Items Addressed This Visit      Cardiovascular and Mediastinum   Essential hypertension   Relevant Orders   Basic metabolic panel     Other   Vitamin D deficiency - Primary   Relevant Orders   VITAMIN D 25 Hydroxy (Vit-D Deficiency, Fractures)   BREAST CANCER, HX OF    Other Visit Diagnoses    Need for influenza vaccination       Relevant Orders   Flu Vaccine QUAD High Dose(Fluad) (Completed)   Encounter for screening mammogram for malignant neoplasm of breast       Relevant Orders   MM Digital Screening      No orders of the defined types were placed in this encounter.   Follow-up: Return in about 6 months (around 12/27/2020), or if symptoms worsen or fail to improve.  Recommended Covid booster 8 months after her second [place] with [employer].  Continue continue current medication regimen for hypertension.  Will adjust vitamin D dosing pending results of lab.  Libby Maw, MD

## 2020-07-02 ENCOUNTER — Other Ambulatory Visit: Payer: Self-pay | Admitting: Family Medicine

## 2020-07-03 ENCOUNTER — Other Ambulatory Visit: Payer: Self-pay | Admitting: Family Medicine

## 2020-07-17 ENCOUNTER — Other Ambulatory Visit: Payer: Self-pay

## 2020-07-17 ENCOUNTER — Ambulatory Visit
Admission: RE | Admit: 2020-07-17 | Discharge: 2020-07-17 | Disposition: A | Discharge status: Home / Self Care | Payer: Medicare Other | Source: Ambulatory Visit | Attending: Family Medicine | Admitting: Family Medicine

## 2020-07-17 DIAGNOSIS — Z1231 Encounter for screening mammogram for malignant neoplasm of breast: Secondary | ICD-10-CM | POA: Diagnosis not present

## 2020-07-29 DIAGNOSIS — Z23 Encounter for immunization: Secondary | ICD-10-CM | POA: Diagnosis not present

## 2020-08-04 ENCOUNTER — Telehealth: Payer: Self-pay | Admitting: Family Medicine

## 2020-08-04 NOTE — Telephone Encounter (Signed)
Okay to go ahead and have the Shingrix vaccine.

## 2020-08-04 NOTE — Telephone Encounter (Signed)
Pt wants to get shingles shot, Is that ok? And pt just had booster shot for covid vaccine on 07/27/20 and wanted to know how long she should wait in between if its ok for him to get

## 2020-08-06 NOTE — Telephone Encounter (Signed)
Spoke with patient who verbally that you either schedule an appointment online to have shingles vaccine or walk in to [facility].

## 2020-08-06 NOTE — Telephone Encounter (Signed)
Pt has medicare and would need to get it done at a pharmacy for it to be covered, please advise

## 2020-09-16 ENCOUNTER — Other Ambulatory Visit: Payer: Self-pay | Admitting: Family Medicine

## 2020-10-01 DIAGNOSIS — H4423 Degenerative myopia, bilateral: Secondary | ICD-10-CM | POA: Diagnosis not present

## 2020-10-01 DIAGNOSIS — H40003 Preglaucoma, unspecified, bilateral: Secondary | ICD-10-CM | POA: Diagnosis not present

## 2020-10-02 ENCOUNTER — Other Ambulatory Visit: Payer: Self-pay | Admitting: Family Medicine

## 2020-12-13 ENCOUNTER — Other Ambulatory Visit: Payer: Self-pay | Admitting: Family

## 2020-12-28 ENCOUNTER — Other Ambulatory Visit: Payer: Self-pay | Admitting: Family

## 2020-12-28 ENCOUNTER — Other Ambulatory Visit: Payer: Self-pay

## 2020-12-28 ENCOUNTER — Encounter: Payer: Self-pay | Admitting: Family Medicine

## 2020-12-28 ENCOUNTER — Ambulatory Visit (INDEPENDENT_AMBULATORY_CARE_PROVIDER_SITE_OTHER): Payer: Medicare Other | Admitting: Family Medicine

## 2020-12-28 VITALS — BP 138/70 | HR 83 | Temp 97.5°F | Ht 63.0 in | Wt 151.8 lb

## 2020-12-28 DIAGNOSIS — E559 Vitamin D deficiency, unspecified: Secondary | ICD-10-CM

## 2020-12-28 DIAGNOSIS — I10 Essential (primary) hypertension: Secondary | ICD-10-CM | POA: Diagnosis not present

## 2020-12-28 DIAGNOSIS — E7800 Pure hypercholesterolemia, unspecified: Secondary | ICD-10-CM | POA: Diagnosis not present

## 2020-12-28 NOTE — Progress Notes (Signed)
Established Patient Office Visit  Subjective:  Patient ID: Heidi Sanchez, female    DOB: 1941-07-28  Age: 80 y.o. MRN: [id number]  CC:  Chief Complaint  Patient presents with  . Follow-up    6 month follow up, no concerns.     HPI Heidi Sanchez presents for follow-up of hypertension, elevated cholesterol, vitamin D deficiency and allergy rhinitis.  Uses Flonase and Benadryl at at bedtime her allergy symptoms that go year-round.  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  She is taking 3000 units of vitamin D daily.  Blood pressures well controlled with Accupril and amlodipine.  She is taking Zocor without issue for her elevated cholesterol nonfasting this morning.  Past Medical History:  Diagnosis Date  . Breast cancer ([place])   . Cancer [facility])    breast  . Colonic polyp   . Hyperlipidemia   . Hypertension   . Personal history of radiation therapy     Past Surgical History:  Procedure Laterality Date  . BREAST BIOPSY    . BREAST LUMPECTOMY Right    1998    Family History  Problem Relation Age of Onset  . Cancer Mother   . Cancer Father     Social History   Socioeconomic History  . Marital status: Married    Spouse name: Not on file  . Number of children: Not on file  . Years of education: Not on file  . Highest education level: Not on file  Occupational History  . Not on file  Tobacco Use  . Smoking status: Never Smoker  . Smokeless tobacco: Never Used  Substance and Sexual Activity  . Alcohol use: No  . Drug use: No  . Sexual activity: Not on file  Other Topics Concern  . Not on file  Social History Narrative   HPOA is her husband, Aadhira Heffernan.     She would want CPR- no feeding tube or "heroic measures."   Social Determinants of Health   Financial Resource Strain: Low Risk   . Difficulty of Paying Living Expenses: Not hard at all  Food Insecurity: No Food Insecurity  . Worried About Charity fundraiser in the Last Year: Never true   . Ran Out of Food in the Last Year: Never true  Transportation Needs: No Transportation Needs  . Lack of Transportation (Medical): No  . Lack of Transportation (Non-Medical): No  Physical Activity: Sufficiently Active  . Days of Exercise per Week: 6 days  . Minutes of Exercise per Session: 40 min  Stress: No Stress Concern Present  . Feeling of Stress : Not at all  Social Connections: Socially Integrated  . Frequency of Communication with Friends and Family: More than three times a week  . Frequency of Social Gatherings with Friends and Family: More than three times a week  . Attends Religious Services: More than 4 times per year  . Active Member of Clubs or Organizations: Yes  . Attends Archivist Meetings: More than 4 times per year  . Marital Status: Married  Human resources officer Violence: Not At Risk  . Fear of Current or Ex-Partner: No  . Emotionally Abused: No  . Physically Abused: No  . Sexually Abused: No    Outpatient Medications Prior to Visit  Medication Sig Dispense Refill  . amLODipine (NORVASC) 5 MG tablet TAKE 1 TABLET (5 MG TOTAL) BY MOUTH DAILY. PLEASE SCHEDULE VISIT WITH NEW PROVIDER 90 tablet 0  . Cholecalciferol (VITAMIN D)  50 MCG (2000 UT) CAPS Take 2,000 Units by mouth daily.    . diphenhydrAMINE (BENADRYL) 25 MG tablet Take 25 mg by mouth as needed.    . fluticasone (FLONASE) 50 MCG/ACT nasal spray Place 2 sprays into both nostrils daily. 16 g 6  . Multiple Vitamins-Minerals (OCUVITE PRESERVISION) TABS 1 tablet 2 (two) times daily. Use as directed    . Omega-3 Fatty Acids (FISH OIL) 1000 MG CAPS Take 1 capsule (1,000 mg total) by mouth daily. 90 capsule 3  . quinapril (ACCUPRIL) 5 MG tablet TAKE 1 TABLET BY MOUTH EVERYDAY AT BEDTIME 90 tablet 1  . simvastatin (ZOCOR) 40 MG tablet TAKE 1 TABLET BY MOUTH EVERYDAY AT BEDTIME 90 tablet 0   No facility-administered medications prior to visit.    Allergies  Allergen Reactions  . Codeine     REACTION:  Nigthmares    ROS Review of Systems  Constitutional: Negative.   HENT: Positive for postnasal drip and sneezing.   Eyes: Negative for photophobia and visual disturbance.  Cardiovascular: Negative.   Gastrointestinal: Negative.   Endocrine: Negative for polyphagia and polyuria.  Genitourinary: Negative.   Musculoskeletal: Negative.   Allergic/Immunologic: Negative for immunocompromised state.  Neurological: Negative for speech difficulty and weakness.  Hematological: Negative.   Psychiatric/Behavioral: Negative.       Objective:    Physical Exam Vitals and nursing note reviewed.  Constitutional:      General: She is not in acute distress.    Appearance: Normal appearance. She is not ill-appearing, toxic-appearing or diaphoretic.  HENT:     Head: Normocephalic and atraumatic.     Right Ear: Tympanic membrane, ear canal and external ear normal.     Left Ear: Tympanic membrane, ear canal and external ear normal.     Mouth/Throat:     Mouth: Mucous membranes are moist.     Pharynx: Oropharynx is clear. No oropharyngeal exudate or posterior oropharyngeal erythema.  Eyes:     General: No scleral icterus.    Extraocular Movements: Extraocular movements intact.     Conjunctiva/sclera: Conjunctivae normal.     Pupils: Pupils are equal, round, and reactive to light.  Pulmonary:     Effort: Pulmonary effort is normal.     Breath sounds: Normal breath sounds.  Abdominal:     General: Bowel sounds are normal.  Musculoskeletal:     Cervical back: No rigidity or tenderness.     Right lower leg: No edema.     Left lower leg: No edema.  Lymphadenopathy:     Cervical: No cervical adenopathy.  Skin:    General: Skin is warm and dry.  Neurological:     Mental Status: She is alert and oriented to person, place, and time.  Psychiatric:        Mood and Affect: Mood normal.        Behavior: Behavior normal.     BP 138/70   Pulse 83   Temp (!) 97.5 F (36.4 C) (Temporal)   Ht 5'  3" (1.6 m)   Wt 151 lb 12.8 oz (68.9 kg)   SpO2 98%   BMI 26.89 kg/m  Wt Readings from Last 3 Encounters:  12/28/20 151 lb 12.8 oz (68.9 kg)  06/29/20 154 lb 9.6 oz (70.1 kg)  04/29/20 153 lb (69.4 kg)     Health Maintenance Due  Topic Date Due  . TETANUS/TDAP  02/12/2019    There are no preventive care reminders to display for this patient.  Lab Results  Component Value Date   TSH 1.35 06/03/2019   Lab Results  Component Value Date   WBC 6.7 12/26/2019   HGB 13.3 12/26/2019   HCT 39.2 12/26/2019   MCV 95.7 12/26/2019   PLT 229.0 12/26/2019   Lab Results  Component Value Date   NA 143 06/29/2020   K 3.9 06/29/2020   CO2 28 06/29/2020   GLUCOSE 110 (H) 06/29/2020   BUN 11 06/29/2020   CREATININE 0.78 06/29/2020   BILITOT 0.5 12/26/2019   ALKPHOS 57 12/26/2019   AST 19 12/26/2019   ALT 23 12/26/2019   PROT 6.7 12/26/2019   ALBUMIN 4.2 12/26/2019   CALCIUM 10.2 06/29/2020   GFR 70.76 06/29/2020   Lab Results  Component Value Date   CHOL 125 12/26/2019   Lab Results  Component Value Date   HDL 25.00 (L) 12/26/2019   Lab Results  Component Value Date   LDLCALC 62 12/26/2019   Lab Results  Component Value Date   TRIG 190.0 (H) 12/26/2019   Lab Results  Component Value Date   CHOLHDL 5 12/26/2019   No results found for: HGBA1C    Assessment & Plan:   Problem List Items Addressed This Visit      Cardiovascular and Mediastinum   Essential hypertension   Relevant Orders   CBC   Comprehensive metabolic panel     Other   Vitamin D deficiency - Primary   Relevant Orders   VITAMIN D 25 Hydroxy (Vit-D Deficiency, Fractures)   Pure hypercholesterolemia   Relevant Orders   Comprehensive metabolic panel   LDL cholesterol, direct      No orders of the defined types were placed in this encounter.   Follow-up: No follow-ups on file.  Continue current medicines as indicated above.  Will make adjustments pending lab results.  Advised Shingrix  vaccine.  Libby Maw, MD

## 2020-12-29 ENCOUNTER — Other Ambulatory Visit: Payer: Self-pay | Admitting: Family Medicine

## 2020-12-29 LAB — COMPREHENSIVE METABOLIC PANEL
ALT: 15 U/L (ref 0–35)
AST: 14 U/L (ref 0–37)
Albumin: 4.2 g/dL (ref 3.5–5.2)
Alkaline Phosphatase: 62 U/L (ref 39–117)
BUN: 13 mg/dL (ref 6–23)
CO2: 30 mEq/L (ref 19–32)
Calcium: 10.4 mg/dL (ref 8.4–10.5)
Chloride: 105 mEq/L (ref 96–112)
Creatinine, Ser: 0.77 mg/dL (ref 0.40–1.20)
GFR: 71.95 mL/min (ref >60.00)
Glucose, Bld: 90 mg/dL (ref 70–99)
Potassium: 4.3 mEq/L (ref 3.5–5.1)
Sodium: 142 mEq/L (ref 135–145)
Total Bilirubin: 0.7 mg/dL (ref 0.2–1.2)
Total Protein: 6.9 g/dL (ref 6.0–8.3)

## 2020-12-29 LAB — CBC
HCT: 40.9 % (ref 36.0–46.0)
Hemoglobin: 13.8 g/dL (ref 12.0–15.0)
MCHC: 33.8 g/dL (ref 30.0–36.0)
MCV: 96.5 fl (ref 78.0–100.0)
Platelets: 222 10*3/uL (ref 150.0–400.0)
RBC: 4.24 Mil/uL (ref 3.87–5.11)
RDW: 13.1 % (ref 11.5–15.5)
WBC: 6.5 10*3/uL (ref 4.0–10.5)

## 2020-12-29 LAB — LDL CHOLESTEROL, DIRECT: Direct LDL: 92 mg/dL

## 2020-12-29 LAB — VITAMIN D 25 HYDROXY (VIT D DEFICIENCY, FRACTURES): VITD: 41.45 ng/mL (ref 30.00–100.00)

## 2021-03-14 ENCOUNTER — Other Ambulatory Visit: Payer: Self-pay | Admitting: Family

## 2021-03-26 ENCOUNTER — Other Ambulatory Visit: Payer: Self-pay | Admitting: Family

## 2021-03-26 ENCOUNTER — Other Ambulatory Visit: Payer: Self-pay | Admitting: Family Medicine

## 2021-03-30 ENCOUNTER — Telehealth: Payer: Self-pay | Admitting: Family Medicine

## 2021-03-30 MED ORDER — SIMVASTATIN 40 MG PO TABS: ORAL_TABLET | ORAL | 0 refills | Status: DC

## 2021-03-30 NOTE — Telephone Encounter (Signed)
What is the name of the medication? Heidi Sanchez (ZOCOR) 40 MG tablet [[id number]]   Have you contacted your pharmacy to request a refill? Yes, her pharmacist told her to get in contact with Dr. Ethelene Hal.   Which pharmacy would you like this sent to? Pharmacy  [id number] [address], [place], [place] [address]  Phone:  [phone]  Fax:  [phone]  DEA #:  [id number]     Patient notified that their request is being sent to the clinical staff for review and that they should receive a call once it is complete. If they do not receive a call within 72 hours they can check with their pharmacy or our office.

## 2021-04-02 DIAGNOSIS — H4423 Degenerative myopia, bilateral: Secondary | ICD-10-CM | POA: Diagnosis not present

## 2021-05-05 ENCOUNTER — Ambulatory Visit: Payer: Medicare Other

## 2021-05-11 ENCOUNTER — Ambulatory Visit (INDEPENDENT_AMBULATORY_CARE_PROVIDER_SITE_OTHER): Payer: Medicare Other | Admitting: *Deleted

## 2021-05-11 DIAGNOSIS — Z0000 Encounter for general adult medical examination without abnormal findings: Secondary | ICD-10-CM

## 2021-05-11 NOTE — Progress Notes (Signed)
Subjective:   Heidi Sanchez is a 80 y.o. female who presents for Medicare Annual (Subsequent) preventive examination.  I connected with  Heidi Sanchez on 05/11/21 by a telephone enabled telemedicine application and verified that I am speaking with the correct person using two identifiers.   I discussed the limitations of evaluation and management by telemedicine. The patient expressed understanding and agreed to proceed.   Review of Systems    NA Cardiac Risk Factors include: advanced age (>6mn, >>79women);hypertension     Objective:    Today's Vitals   There is no height or weight on file to calculate BMI.  Advanced Directives 05/11/2021 04/29/2020 04/24/2019 09/15/2016  Does Patient Have a Medical Advance Directive? No Yes Yes Yes  Type of Advance Directive - [place]  Does patient want to make changes to medical advance directive? - - No - Patient declined -  Copy of [place] Chart? - No - copy requested No - copy requested No - copy requested  Would patient like information on creating a medical advance directive? No - Patient declined - - -    Current Medications (verified) Outpatient Encounter Medications as of 05/11/2021  Medication Sig   amLODipine (NORVASC) 5 MG tablet TAKE 1 TABLET (5 MG TOTAL) BY MOUTH DAILY. PLEASE SCHEDULE VISIT WITH NEW PROVIDER   Cholecalciferol (VITAMIN D) 50 MCG (2000 UT) CAPS Take 2,000 Units by mouth daily.   diphenhydrAMINE (BENADRYL) 25 MG tablet Take 25 mg by mouth as needed.   fluticasone (FLONASE) 50 MCG/ACT nasal spray SPRAY 2 SPRAYS INTO EACH NOSTRIL EVERY DAY   Multiple Vitamins-Minerals (OCUVITE PRESERVISION) TABS 1 tablet 2 (two) times daily. Use as directed   Omega-3 Fatty Acids (FISH OIL) 1000 MG CAPS Take 1 capsule (1,000 mg total) by mouth daily.   quinapril (ACCUPRIL) 5 MG tablet TAKE 1 TABLET  BY MOUTH EVERYDAY AT BEDTIME   simvastatin (ZOCOR) 40 MG tablet TAKE 1 TABLET BY MOUTH EVERYDAY AT BEDTIME   No facility-administered encounter medications on file as of 05/11/2021.    Allergies (verified) Codeine   History: Past Medical History:  Diagnosis Date   [place]ast cancer [place]    [place]    [place]   Colonic polyp    Hyperlipidemia    Hypertension    Personal history of radiation therapy    Past Surgical History:  Procedure Laterality Date   BREAST BIOPSY     BREAST LUMPECTOMY Right    1998   Family History  Problem Relation Age of Onset   Cancer Mother    Cancer Father    Social History   Socioeconomic History   Marital status: Married    Spouse name: Not on file   Number of children: Not on file   Years of education: Not on file   Highest education level: Not on file  Occupational History   Not on file  Tobacco Use   Smoking status: Never   Smokeless tobacco: Never  Substance and Sexual Activity   Alcohol use: No   Drug use: No   Sexual activity: Yes  Other Topics Concern   Not on file  Social History Narrative   HPOA is her husband, CAbella Euceda     She would want CPR- no feeding tube or "heroic measures."   Social Determinants of Health   Financial Resource Strain: Low Risk    Difficulty  of Paying Living Expenses: Not hard at all  Food Insecurity: No Food Insecurity   Worried About [place] in the Last Year: Never true   [place] in the Last Year: Never true  Transportation Needs: No Transportation Needs   Lack of Transportation (Medical): No   Lack of Transportation (Non-Medical): No  Physical Activity: Sufficiently Active   Days of Exercise per Week: 5 days   Minutes of Exercise per Session: 40 min  Stress: No Stress Concern Present   Feeling of Stress : Not at all  Social Connections: Moderately Integrated   Frequency of Communication with Friends and Family: More than three times a week   Frequency of Social  Gatherings with Friends and Family: Twice a week   Attends Religious Services: More than 4 times per year   Active Member of [employer] or Organizations: No   Attends Archivist Meetings: Never   Marital Status: Married    Tobacco Counseling Counseling given: Not Answered   Clinical Intake:                 Diabetic?NO         Activities of Daily Living In your present state of health, do you have any difficulty performing the following activities: 05/11/2021  Hearing? N  Vision? N  Difficulty concentrating or making decisions? N  Walking or climbing stairs? N  Dressing or bathing? N  Doing errands, shopping? N  Preparing Food and eating ? N  Using the Toilet? N  In the past six months, have you accidently leaked urine? N  Do you have problems with loss of bowel control? N  Managing your Medications? N  Managing your Finances? N  Housekeeping or managing your Housekeeping? N  Some recent data might be hidden    Patient Care Team: Libby Maw, MD as PCP - General (Family Medicine) Oneta Rack, MD as Consulting Physician (Dermatology) Leandrew Koyanagi, MD as Referring Physician (Ophthalmology) Vernon Prey, MD as Referring Physician (Ophthalmology) Boykin Nearing, DDS as Consulting Physician (Dentistry)  Indicate any recent Medical Services you may have received from other than [facility] providers in the past year (date may be approximate).     Assessment:   This is a routine wellness examination for [place].  Hearing/Vision screen No results found.  Dietary issues and exercise activities discussed: Current Exercise Habits: Home exercise routine, Type of exercise: stretching;walking, Time (Minutes): 40, Frequency (Times/Week): 5, Weekly Exercise (Minutes/Week): 200, Intensity: Moderate   Goals Addressed             This Visit's Progress    Patient Stated   On track    Continue walking regimen     Patient Stated        Continue health lifestyle        Depression Screen PHQ 2/9 Scores 05/11/2021 12/28/2020 04/29/2020 04/24/2019 11/28/2017 09/15/2016 09/14/2015  PHQ - 2 Score 0 1 0 0 0 0 0    Fall Risk Fall Risk  05/11/2021 12/28/2020 04/29/2020 12/26/2019 04/24/2019  Falls in the past year? 0 0 0 0 0  Comment - - - - -  Number falls in past yr: 0 - 0 - -  Comment - - - - -  Injury with Fall? 0 - 0 - -  Follow up Falls evaluation completed;Falls prevention discussed - Falls prevention discussed - -    FALL RISK PREVENTION PERTAINING TO THE HOME:  Any stairs in or around the  home? Yes  If so, are there any without handrails? Yes  Home free of loose throw rugs in walkways, pet beds, electrical cords, etc? Yes  Adequate lighting in your home to reduce risk of falls? Yes   ASSISTIVE DEVICES UTILIZED TO PREVENT FALLS:  Life alert? No  Use of a cane, walker or w/c? No  Grab bars in the bathroom? Yes  Shower chair or bench in shower? Yes  Elevated toilet seat or a handicapped toilet? No   TIMED UP AND GO:  Was the test performed? No .    Cognitive Function:  Normal cognitive status assessed by direct observation by this Nurse Health Advisor. No abnormalities found.    MMSE - Mini Mental State Exam 09/15/2016  Orientation to time 5  Orientation to Place 5  Registration 3  Attention/ Calculation 0  Recall 3  Language- name 2 objects 0  Language- repeat 1  Language- follow 3 step command 3  Language- read & follow direction 0  Write a sentence 0  Copy design 0  Total score 20        Immunizations Immunization History  Administered Date(s) Administered   Fluad Quad(high Dose 65+) 06/03/2019, 06/29/2020   Influenza, High Dose Seasonal PF 07/10/2017, 07/04/2018   Influenza-Unspecified 07/11/2016   PFIZER(Purple Top)SARS-COV-2 Vaccination 11/17/2019, 12/10/2019, 07/29/2020   Pneumococcal Conjugate-13 05/26/2014   Pneumococcal Polysaccharide-23 09/15/2016   Td 02/11/2009   Zoster, Live  02/15/2010    TDAP status: Due, Education has been provided regarding the importance of this vaccine. Advised may receive this vaccine at local pharmacy or Health Dept. Aware to provide a copy of the vaccination record if obtained from local pharmacy or Health Dept. Verbalized acceptance and understanding.  Flu Vaccine status: Up to date  Pneumococcal vaccine status: Due, Education has been provided regarding the importance of this vaccine. Advised may receive this vaccine at local pharmacy or Health Dept. Aware to provide a copy of the vaccination record if obtained from local pharmacy or Health Dept. Verbalized acceptance and understanding.  Covid-19 vaccine status: Information provided on how to obtain vaccines.   Qualifies for Shingles Vaccine? Yes   Zostavax completed Yes   Shingrix Completed?: No.    Education has been provided regarding the importance of this vaccine. Patient has been advised to call insurance company to determine out of pocket expense if they have not yet received this vaccine. Advised may also receive vaccine at local pharmacy or Health Dept. Verbalized acceptance and understanding.  Screening Tests Health Maintenance  Topic Date Due   TETANUS/TDAP  02/12/2019   COVID-19 Vaccine (4 - Booster for [employer] series) 10/29/2020   INFLUENZA VACCINE  05/03/2021   DEXA SCAN  Completed   PNA vac Low Risk Adult  Completed   Zoster Vaccines- Shingrix  Completed   HPV VACCINES  Aged Out    Health Maintenance  Health Maintenance Due  Topic Date Due   TETANUS/TDAP  02/12/2019   COVID-19 Vaccine (4 - Booster for Pfizer series) 10/29/2020   INFLUENZA VACCINE  05/03/2021    Colorectal cancer screening: No longer required.   Mammogram status: Completed  . Repeat every year  Bone Density status: Completed  . Results reflect: Bone density results: OSTEOPENIA. Repeat every 2 years.  Lung Cancer Screening: (Low Dose CT Chest recommended if Age 55-80 years, 30 pack-year  currently smoking OR have quit w/in 15years.) does not qualify.   Lung Cancer Screening Referral:   Additional Screening:  Hepatitis C Screening: does not  qualify  Vision Screening: Recommended annual ophthalmology exams for early detection of glaucoma and other disorders of the eye. Is the patient up to date with their annual eye exam?  Yes  Who is the provider or what is the name of the office in which the patient attends annual eye exams? [facility] If pt is not established with a provider, would they like to be referred to a provider to establish care? No .   Dental Screening: Recommended annual dental exams for proper oral hygiene  Community Resource Referral / Chronic Care Management: CRR required this visit?  No   CCM required this visit?  No      Plan:     I have personally reviewed and noted the following in the patient's chart:   Medical and social history Use of alcohol, tobacco or illicit drugs  Current medications and supplements including opioid prescriptions.  Functional ability and status Nutritional status Physical activity Advanced directives List of other physicians Hospitalizations, surgeries, and ER visits in previous 12 months Vitals Screenings to include cognitive, depression, and falls Referrals and appointments  In addition, I have reviewed and discussed with patient certain preventive protocols, quality metrics, and best practice recommendations. A written personalized care plan for preventive services as well as general preventive health recommendations were provided to patient.     Leroy Kennedy, LPN   [id number]   Nurse Notes: na

## 2021-05-11 NOTE — Patient Instructions (Signed)
Heidi Sanchez , Thank you for taking time to come for your Medicare Wellness Visit. I appreciate your ongoing commitment to your health goals. Please review the following plan we discussed and let me know if I can assist you in the future.   Screening recommendations/referrals: Colonoscopy: no longer required Mammogram: up to date Bone Density: up to date Recommended yearly ophthalmology/optometry visit for glaucoma screening and checkup Recommended yearly dental visit for hygiene and checkup  Vaccinations: Influenza vaccine: up to date Pneumococcal vaccine: Education provided Tdap vaccine: Education provided Shingles vaccine: Education provided    Advanced directives: Education provided  Conditions/risks identified: na  Next appointment: 07-01-2021 @ 10:00 Dr. Ethelene Hal   Preventive Care 82 Years and Older, Female Preventive care refers to lifestyle choices and visits with your health care provider that can promote health and wellness. What does preventive care include? A yearly physical exam. This is also called an annual well check. Dental exams once or twice a year. Routine eye exams. Ask your health care provider how often you should have your eyes checked. Personal lifestyle choices, including: Daily care of your teeth and gums. Regular physical activity. Eating a healthy diet. Avoiding tobacco and drug use. Limiting alcohol use. Practicing safe sex. Taking low-dose aspirin every day. Taking vitamin and mineral supplements as recommended by your health care provider. What happens during an annual well check? The services and screenings done by your health care provider during your annual well check will depend on your age, overall health, lifestyle risk factors, and family history of disease. Counseling  Your health care provider may ask you questions about your: Alcohol use. Tobacco use. Drug use. Emotional well-being. Home and relationship well-being. Sexual  activity. Eating habits. History of falls. Memory and ability to understand (cognition). Work and work Statistician. Reproductive health. Screening  You may have the following tests or measurements: Height, weight, and BMI. Blood pressure. Lipid and cholesterol levels. These may be checked every 5 years, or more frequently if you are over 82 years old. Skin check. Lung cancer screening. You may have this screening every year starting at age 82 if you have a 30-pack-year history of smoking and currently smoke or have quit within the past 15 years. Fecal occult blood test (FOBT) of the stool. You may have this test every year starting at age 82. Flexible sigmoidoscopy or colonoscopy. You may have a sigmoidoscopy every 5 years or a colonoscopy every 10 years starting at age 82. Hepatitis C blood test. Hepatitis B blood test. Sexually transmitted disease (STD) testing. Diabetes screening. This is done by checking your blood sugar (glucose) after you have not eaten for a while (fasting). You may have this done every 1-3 years. Bone density scan. This is done to screen for osteoporosis. You may have this done starting at age 82. Mammogram. This may be done every 1-2 years. Talk to your health care provider about how often you should have regular mammograms. Talk with your health care provider about your test results, treatment options, and if necessary, the need for more tests. Vaccines  Your health care provider may recommend certain vaccines, such as: Influenza vaccine. This is recommended every year. Tetanus, diphtheria, and acellular pertussis (Tdap, Td) vaccine. You may need a Td booster every 10 years. Zoster vaccine. You may need this after age 82. Pneumococcal 13-valent conjugate (PCV13) vaccine. One dose is recommended after age 82. Pneumococcal polysaccharide (PPSV23) vaccine. One dose is recommended after age 82. Talk to your health care provider about  which screenings and vaccines  you need and how often you need them. This information is not intended to replace advice given to you by your health care provider. Make sure you discuss any questions you have with your health care provider. Document Released: 10/16/2015 Document Revised: 06/08/2016 Document Reviewed: 07/21/2015 Elsevier Interactive Patient Education  2017 [employer] Prevention in the Home Falls can cause injuries. They can happen to people of all ages. There are many things you can do to make your home safe and to help prevent falls. What can I do on the outside of my home? Regularly fix the edges of walkways and driveways and fix any cracks. Remove anything that might make you trip as you walk through a door, such as a raised step or threshold. Trim any bushes or trees on the path to your home. Use bright outdoor lighting. Clear any walking paths of anything that might make someone trip, such as rocks or tools. Regularly check to see if handrails are loose or broken. Make sure that both sides of any steps have handrails. Any raised decks and porches should have guardrails on the edges. Have any leaves, snow, or ice cleared regularly. Use sand or salt on walking paths during winter. Clean up any spills in your garage right away. This includes oil or grease spills. What can I do in the bathroom? Use night lights. Install grab bars by the toilet and in the tub and shower. Do not use towel bars as grab bars. Use non-skid mats or decals in the tub or shower. If you need to sit down in the shower, use a plastic, non-slip stool. Keep the floor dry. Clean up any water that spills on the floor as soon as it happens. Remove soap buildup in the tub or shower regularly. Attach bath mats securely with double-sided non-slip rug tape. Do not have throw rugs and other things on the floor that can make you trip. What can I do in the bedroom? Use night lights. Make sure that you have a light by your bed that  is easy to reach. Do not use any [place] or blankets that are too big for your bed. They should not hang down onto the floor. Have a firm chair that has side arms. You can use this for support while you get dressed. Do not have throw rugs and other things on the floor that can make you trip. What can I do in the kitchen? Clean up any spills right away. Avoid walking on wet floors. Keep items that you use a lot in easy-to-reach places. If you need to reach something above you, use a strong step stool that has a grab bar. Keep electrical cords out of the way. Do not use floor polish or wax that makes floors slippery. If you must use wax, use non-skid floor wax. Do not have throw rugs and other things on the floor that can make you trip. What can I do with my stairs? Do not leave any items on the stairs. Make sure that there are handrails on both sides of the stairs and use them. Fix handrails that are broken or loose. Make sure that handrails are as long as the stairways. Check any carpeting to make sure that it is firmly attached to the stairs. Fix any carpet that is loose or worn. Avoid having throw rugs at the top or bottom of the stairs. If you do have throw rugs, attach them to the floor with  carpet tape. Make sure that you have a light switch at the top of the stairs and the bottom of the stairs. If you do not have them, ask someone to add them for you. What else can I do to help prevent falls? Wear shoes that: Do not have high heels. Have rubber bottoms. Are comfortable and fit you well. Are closed at the toe. Do not wear sandals. If you use a stepladder: Make sure that it is fully opened. Do not climb a closed stepladder. Make sure that both sides of the stepladder are locked into place. Ask someone to hold it for you, if possible. Clearly mark and make sure that you can see: Any grab bars or handrails. First and last steps. Where the edge of each step is. Use tools that help you  move around (mobility aids) if they are needed. These include: Canes. Walkers. Scooters. Crutches. Turn on the lights when you go into a dark area. Replace any light bulbs as soon as they burn out. Set up your furniture so you have a clear path. Avoid moving your furniture around. If any of your floors are uneven, fix them. If there are any pets around you, be aware of where they are. Review your medicines with your doctor. Some medicines can make you feel dizzy. This can increase your chance of falling. Ask your doctor what other things that you can do to help prevent falls. This information is not intended to replace advice given to you by your health care provider. Make sure you discuss any questions you have with your health care provider. Document Released: 07/16/2009 Document Revised: 02/25/2016 Document Reviewed: 10/24/2014 Elsevier Interactive Patient Education  2017 [employer].

## 2021-06-09 ENCOUNTER — Other Ambulatory Visit: Payer: Self-pay | Admitting: Family Medicine

## 2021-06-09 DIAGNOSIS — Z1231 Encounter for screening mammogram for malignant neoplasm of breast: Secondary | ICD-10-CM

## 2021-06-14 DIAGNOSIS — Z23 Encounter for immunization: Secondary | ICD-10-CM | POA: Diagnosis not present

## 2021-06-18 DIAGNOSIS — L570 Actinic keratosis: Secondary | ICD-10-CM | POA: Diagnosis not present

## 2021-06-18 DIAGNOSIS — D485 Neoplasm of uncertain behavior of skin: Secondary | ICD-10-CM | POA: Diagnosis not present

## 2021-06-18 DIAGNOSIS — D2262 Melanocytic nevi of left upper limb, including shoulder: Secondary | ICD-10-CM | POA: Diagnosis not present

## 2021-06-18 DIAGNOSIS — L821 Other seborrheic keratosis: Secondary | ICD-10-CM | POA: Diagnosis not present

## 2021-06-18 DIAGNOSIS — Z85828 Personal history of other malignant neoplasm of skin: Secondary | ICD-10-CM | POA: Diagnosis not present

## 2021-06-18 DIAGNOSIS — D2261 Melanocytic nevi of right upper limb, including shoulder: Secondary | ICD-10-CM | POA: Diagnosis not present

## 2021-06-18 DIAGNOSIS — D0439 Carcinoma in situ of skin of other parts of face: Secondary | ICD-10-CM | POA: Diagnosis not present

## 2021-06-18 DIAGNOSIS — D2271 Melanocytic nevi of right lower limb, including hip: Secondary | ICD-10-CM | POA: Diagnosis not present

## 2021-06-23 ENCOUNTER — Telehealth: Payer: Self-pay | Admitting: Family Medicine

## 2021-06-23 DIAGNOSIS — I10 Essential (primary) hypertension: Secondary | ICD-10-CM

## 2021-06-23 MED ORDER — AMLODIPINE BESYLATE 5 MG PO TABS: ORAL_TABLET | ORAL | 0 refills | Status: DC

## 2021-06-23 NOTE — Telephone Encounter (Signed)
Requested Rx sent in  

## 2021-06-24 ENCOUNTER — Other Ambulatory Visit: Payer: Self-pay | Admitting: Family Medicine

## 2021-06-26 ENCOUNTER — Other Ambulatory Visit: Payer: Self-pay | Admitting: Family Medicine

## 2021-07-01 ENCOUNTER — Ambulatory Visit (INDEPENDENT_AMBULATORY_CARE_PROVIDER_SITE_OTHER): Payer: Medicare Other | Admitting: Family Medicine

## 2021-07-01 ENCOUNTER — Other Ambulatory Visit: Payer: Self-pay

## 2021-07-01 ENCOUNTER — Encounter: Payer: Self-pay | Admitting: Family Medicine

## 2021-07-01 VITALS — BP 148/72 | HR 78 | Temp 97.8°F | Ht 63.0 in | Wt 147.2 lb

## 2021-07-01 DIAGNOSIS — Z23 Encounter for immunization: Secondary | ICD-10-CM | POA: Diagnosis not present

## 2021-07-01 DIAGNOSIS — I10 Essential (primary) hypertension: Secondary | ICD-10-CM | POA: Diagnosis not present

## 2021-07-01 DIAGNOSIS — E7800 Pure hypercholesterolemia, unspecified: Secondary | ICD-10-CM

## 2021-07-01 MED ORDER — AMLODIPINE BESYLATE 5 MG PO TABS: ORAL_TABLET | ORAL | 0 refills | Status: DC

## 2021-07-01 MED ORDER — QUINAPRIL HCL 10 MG PO TABS
10.0000 mg | ORAL_TABLET | Freq: Every day | ORAL | 0 refills | Status: DC
Start: 2021-07-01 — End: 2021-09-29

## 2021-07-01 NOTE — Progress Notes (Signed)
Established Patient Office Visit  Subjective:  Patient ID: Heidi Sanchez, female    DOB: 07/28/1941  Age: 80 y.o. MRN: [id number]  CC:  Chief Complaint  Patient presents with   Follow-up    6 month follow up, no concerns.     HPI Heidi Sanchez presents for follow-up of hypertension and elevated cholesterol.  Blood pressure has been running in the 120s over 80s at home.  It was elevated at her last clinic visit.  She is here today for her flu vaccine.  She did have her shingles vaccine.  Nonfasting today.  For follow-up of elevated cholesterol.  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  Past Medical History:  Diagnosis Date   Breast cancer ([place])    Cancer ([place])    breast   Colonic polyp    Hyperlipidemia    Hypertension    Personal history of radiation therapy     Past Surgical History:  Procedure Laterality Date   BREAST BIOPSY     BREAST LUMPECTOMY Right    1998    Family History  Problem Relation Age of Onset   Cancer Mother    Cancer Father     Social History   Socioeconomic History   Marital status: Married    Spouse name: Not on file   Number of children: Not on file   Years of education: Not on file   Highest education level: Not on file  Occupational History   Not on file  Tobacco Use   Smoking status: Never   Smokeless tobacco: Never  Substance and Sexual Activity   Alcohol use: No   Drug use: No   Sexual activity: Yes  Other Topics Concern   Not on file  Social History Narrative   HPOA is her husband, Evvie Behrmann.     She would want CPR- no feeding tube or "heroic measures."   Social Determinants of Health   Financial Resource Strain: Low Risk    Difficulty of Paying Living Expenses: Not hard at all  Food Insecurity: No Food Insecurity   Worried About Charity fundraiser in the Last Year: Never true   [place] in the Last Year: Never true  Transportation Needs: No Transportation Needs   Lack of Transportation  (Medical): No   Lack of Transportation (Non-Medical): No  Physical Activity: Sufficiently Active   Days of Exercise per Week: 5 days   Minutes of Exercise per Session: 40 min  Stress: No Stress Concern Present   Feeling of Stress : Not at all  Social Connections: Moderately Integrated   Frequency of Communication with Friends and Family: More than three times a week   Frequency of Social Gatherings with Friends and Family: Twice a week   Attends Religious Services: More than 4 times per year   Active Member of [employer] or Organizations: No   Attends Archivist Meetings: Never   Marital Status: Married  Human resources officer Violence: Not At Risk   Fear of Current or Ex-Partner: No   Emotionally Abused: No   Physically Abused: No   Sexually Abused: No    Outpatient Medications Prior to Visit  Medication Sig Dispense Refill   Cholecalciferol (VITAMIN D) 50 MCG (2000 UT) CAPS Take 2,000 Units by mouth daily.     diphenhydrAMINE (BENADRYL) 25 MG tablet Take 25 mg by mouth as needed.     fluorouracil (EFUDEX) 5 % cream Apply topically 2 (two) times daily.  fluticasone (FLONASE) 50 MCG/ACT nasal spray SPRAY 2 SPRAYS INTO EACH NOSTRIL EVERY DAY 16 mL 6   Multiple Vitamins-Minerals (OCUVITE PRESERVISION) TABS 1 tablet 2 (two) times daily. Use as directed     Omega-3 Fatty Acids (FISH OIL) 1000 MG CAPS Take 1 capsule (1,000 mg total) by mouth daily. 90 capsule 3   simvastatin (ZOCOR) 40 MG tablet TAKE 1 TABLET BY MOUTH EVERYDAY AT BEDTIME 90 tablet 0   amLODipine (NORVASC) 5 MG tablet TAKE 1 TABLET (5 MG TOTAL) BY MOUTH DAILY. PLEASE SCHEDULE VISIT WITH NEW PROVIDER 90 tablet 0   quinapril (ACCUPRIL) 5 MG tablet TAKE 1 TABLET BY MOUTH EVERYDAY AT BEDTIME 90 tablet 1   No facility-administered medications prior to visit.    Allergies  Allergen Reactions   Codeine     REACTION: Nigthmares    ROS Review of Systems  Constitutional: Negative.   HENT: Negative.    Eyes:  Positive  for visual disturbance.  Respiratory: Negative.    Cardiovascular: Negative.   Gastrointestinal: Negative.   Neurological:  Negative for weakness and headaches.  Psychiatric/Behavioral: Negative.       Objective:    Physical Exam Vitals and nursing note reviewed.  Constitutional:      General: She is not in acute distress.    Appearance: Normal appearance. She is normal weight. She is not ill-appearing, toxic-appearing or diaphoretic.  HENT:     Head: Normocephalic and atraumatic.     Right Ear: External ear normal.     Left Ear: External ear normal.     Mouth/Throat:     Mouth: Mucous membranes are moist.     Pharynx: Oropharynx is clear. No oropharyngeal exudate or posterior oropharyngeal erythema.  Eyes:     General:        Right eye: No discharge.        Left eye: No discharge.     Extraocular Movements: Extraocular movements intact.     Conjunctiva/sclera: Conjunctivae normal.     Pupils: Pupils are equal, round, and reactive to light.  Neck:     Vascular: No carotid bruit.  Cardiovascular:     Rate and Rhythm: Normal rate and regular rhythm.  Pulmonary:     Effort: Pulmonary effort is normal.     Breath sounds: Normal breath sounds.  Musculoskeletal:     Cervical back: No rigidity or tenderness.     Right lower leg: No edema.     Left lower leg: No edema.  Lymphadenopathy:     Cervical: No cervical adenopathy.  Skin:    General: Skin is warm and dry.  Neurological:     Mental Status: She is alert and oriented to person, place, and time.  Psychiatric:        Mood and Affect: Mood normal.        Behavior: Behavior normal.    BP (!) 148/72 (BP Location: Left Arm, Patient Position: Sitting, Cuff Size: Normal)   Pulse 78   Temp 97.8 F (36.6 C) (Temporal)   Ht 5\' 3"  (1.6 m)   Wt 147 lb 3.2 oz (66.8 kg)   SpO2 98%   BMI 26.08 kg/m  Wt Readings from Last 3 Encounters:  07/01/21 147 lb 3.2 oz (66.8 kg)  12/28/20 151 lb 12.8 oz (68.9 kg)  06/29/20 154 lb  9.6 oz (70.1 kg)     Health Maintenance Due  Topic Date Due   TETANUS/TDAP  02/12/2019    There are no preventive care reminders  to display for this patient.  Lab Results  Component Value Date   TSH 1.35 06/03/2019   Lab Results  Component Value Date   WBC 6.5 12/28/2020   HGB 13.8 12/28/2020   HCT 40.9 12/28/2020   MCV 96.5 12/28/2020   PLT 222.0 12/28/2020   Lab Results  Component Value Date   NA 142 12/28/2020   K 4.3 12/28/2020   CO2 30 12/28/2020   GLUCOSE 90 12/28/2020   BUN 13 12/28/2020   CREATININE 0.77 12/28/2020   BILITOT 0.7 12/28/2020   ALKPHOS 62 12/28/2020   AST 14 12/28/2020   ALT 15 12/28/2020   PROT 6.9 12/28/2020   ALBUMIN 4.2 12/28/2020   CALCIUM 10.4 12/28/2020   GFR 71.95 12/28/2020   Lab Results  Component Value Date   CHOL 125 12/26/2019   Lab Results  Component Value Date   HDL 25.00 (L) 12/26/2019   Lab Results  Component Value Date   LDLCALC 62 12/26/2019   Lab Results  Component Value Date   TRIG 190.0 (H) 12/26/2019   Lab Results  Component Value Date   CHOLHDL 5 12/26/2019   No results found for: HGBA1C    Assessment & Plan:   Problem List Items Addressed This Visit       Cardiovascular and Mediastinum   Essential hypertension   Relevant Medications   quinapril (ACCUPRIL) 10 MG tablet   amLODipine (NORVASC) 5 MG tablet     Other   Pure hypercholesterolemia   Relevant Medications   quinapril (ACCUPRIL) 10 MG tablet   amLODipine (NORVASC) 5 MG tablet   Need for influenza vaccination - Primary   Relevant Orders   Flu Vaccine QUAD High Dose(Fluad) (Completed)    Meds ordered this encounter  Medications   quinapril (ACCUPRIL) 10 MG tablet    Sig: Take 1 tablet (10 mg total) by mouth daily.    Dispense:  90 tablet    Refill:  0   amLODipine (NORVASC) 5 MG tablet    Sig: TAKE 1 TABLET (5 MG TOTAL) BY MOUTH DAILY. PLEASE SCHEDULE VISIT WITH NEW PROVIDER    Dispense:  90 tablet    Refill:  0      Follow-up: Return in about 1 month (around 07/31/2021), or Bring your blood pressure cuff and please be fasting..  Have increased to Accupril to 10 mg daily.  Continue amlodipine 10 mg daily.  Return in 1 month for recheck with your blood pressure cuff.  Information given on managing hypertension.  Libby Maw, MD

## 2021-07-20 ENCOUNTER — Other Ambulatory Visit: Payer: Self-pay

## 2021-07-20 ENCOUNTER — Ambulatory Visit
Admission: RE | Admit: 2021-07-20 | Discharge: 2021-07-20 | Disposition: A | Discharge status: Home / Self Care | Payer: Medicare Other | Source: Ambulatory Visit | Attending: Family Medicine | Admitting: Family Medicine

## 2021-07-20 DIAGNOSIS — Z1231 Encounter for screening mammogram for malignant neoplasm of breast: Secondary | ICD-10-CM

## 2021-07-26 ENCOUNTER — Other Ambulatory Visit: Payer: Self-pay | Admitting: Family Medicine

## 2021-07-26 DIAGNOSIS — R928 Other abnormal and inconclusive findings on diagnostic imaging of breast: Secondary | ICD-10-CM

## 2021-08-07 ENCOUNTER — Other Ambulatory Visit: Payer: Medicare Other

## 2021-08-07 ENCOUNTER — Ambulatory Visit
Admission: RE | Admit: 2021-08-07 | Discharge: 2021-08-07 | Disposition: A | Discharge status: Home / Self Care | Payer: Medicare Other | Source: Ambulatory Visit | Attending: Family Medicine | Admitting: Family Medicine

## 2021-08-07 ENCOUNTER — Other Ambulatory Visit: Payer: Self-pay | Admitting: Family Medicine

## 2021-08-07 ENCOUNTER — Other Ambulatory Visit: Payer: Self-pay

## 2021-08-07 DIAGNOSIS — R928 Other abnormal and inconclusive findings on diagnostic imaging of breast: Secondary | ICD-10-CM

## 2021-08-07 DIAGNOSIS — R922 Inconclusive mammogram: Secondary | ICD-10-CM | POA: Diagnosis not present

## 2021-08-07 DIAGNOSIS — Z853 Personal history of malignant neoplasm of breast: Secondary | ICD-10-CM | POA: Diagnosis not present

## 2021-08-09 ENCOUNTER — Ambulatory Visit
Admission: RE | Admit: 2021-08-09 | Discharge: 2021-08-09 | Disposition: A | Discharge status: Home / Self Care | Payer: Medicare Other | Source: Ambulatory Visit | Attending: Family Medicine | Admitting: Family Medicine

## 2021-08-09 ENCOUNTER — Other Ambulatory Visit: Payer: Self-pay

## 2021-08-09 DIAGNOSIS — R928 Other abnormal and inconclusive findings on diagnostic imaging of breast: Secondary | ICD-10-CM

## 2021-08-09 DIAGNOSIS — N6311 Unspecified lump in the right breast, upper outer quadrant: Secondary | ICD-10-CM | POA: Diagnosis not present

## 2021-08-09 DIAGNOSIS — N6011 Diffuse cystic mastopathy of right breast: Secondary | ICD-10-CM | POA: Diagnosis not present

## 2021-08-12 ENCOUNTER — Other Ambulatory Visit: Payer: Medicare Other

## 2021-08-13 DIAGNOSIS — D0439 Carcinoma in situ of skin of other parts of face: Secondary | ICD-10-CM | POA: Diagnosis not present

## 2021-08-16 ENCOUNTER — Ambulatory Visit (INDEPENDENT_AMBULATORY_CARE_PROVIDER_SITE_OTHER): Payer: Medicare Other | Admitting: Family Medicine

## 2021-08-16 ENCOUNTER — Encounter: Payer: Self-pay | Admitting: Family Medicine

## 2021-08-16 ENCOUNTER — Other Ambulatory Visit: Payer: Self-pay

## 2021-08-16 VITALS — BP 180/90 | HR 75 | Temp 97.7°F | Resp 18 | Wt 144.4 lb

## 2021-08-16 DIAGNOSIS — E559 Vitamin D deficiency, unspecified: Secondary | ICD-10-CM

## 2021-08-16 DIAGNOSIS — E7800 Pure hypercholesterolemia, unspecified: Secondary | ICD-10-CM | POA: Diagnosis not present

## 2021-08-16 DIAGNOSIS — I10 Essential (primary) hypertension: Secondary | ICD-10-CM

## 2021-08-16 LAB — COMPREHENSIVE METABOLIC PANEL
ALT: 23 U/L (ref 0–35)
AST: 19 U/L (ref 0–37)
Albumin: 4.6 g/dL (ref 3.5–5.2)
Alkaline Phosphatase: 73 U/L (ref 39–117)
BUN: 13 mg/dL (ref 6–23)
CO2: 30 mEq/L (ref 19–32)
Calcium: 10.7 mg/dL — ABNORMAL HIGH (ref 8.4–10.5)
Chloride: 107 mEq/L (ref 96–112)
Creatinine, Ser: 0.87 mg/dL (ref 0.40–1.20)
GFR: 61.87 mL/min (ref >60.00)
Glucose, Bld: 102 mg/dL — ABNORMAL HIGH (ref 70–99)
Potassium: 4.9 mEq/L (ref 3.5–5.1)
Sodium: 145 mEq/L (ref 135–145)
Total Bilirubin: 0.9 mg/dL (ref 0.2–1.2)
Total Protein: 7.2 g/dL (ref 6.0–8.3)

## 2021-08-16 LAB — LIPID PANEL
Cholesterol: 153 mg/dL (ref 0–200)
HDL: 34 mg/dL — ABNORMAL LOW (ref >39.00)
LDL Cholesterol: 98 mg/dL (ref 0–99)
NonHDL: 119.1
Total CHOL/HDL Ratio: 5
Triglycerides: 105 mg/dL (ref 0.0–149.0)
VLDL: 21 mg/dL (ref 0.0–40.0)

## 2021-08-16 LAB — CBC
HCT: 43.5 % (ref 36.0–46.0)
Hemoglobin: 14.4 g/dL (ref 12.0–15.0)
MCHC: 33 g/dL (ref 30.0–36.0)
MCV: 96.7 fl (ref 78.0–100.0)
Platelets: 210 10*3/uL (ref 150.0–400.0)
RBC: 4.5 Mil/uL (ref 3.87–5.11)
RDW: 13.2 % (ref 11.5–15.5)
WBC: 6.3 10*3/uL (ref 4.0–10.5)

## 2021-08-16 LAB — URINALYSIS, ROUTINE W REFLEX MICROSCOPIC
Bilirubin Urine: NEGATIVE
Hgb urine dipstick: NEGATIVE
Ketones, ur: NEGATIVE
Leukocytes,Ua: NEGATIVE
Nitrite: NEGATIVE
RBC / HPF: NONE SEEN (ref 0–?)
Specific Gravity, Urine: 1.015 (ref 1.000–1.030)
Total Protein, Urine: NEGATIVE
Urine Glucose: NEGATIVE
Urobilinogen, UA: 0.2 (ref 0.0–1.0)
pH: 7.5 (ref 5.0–8.0)

## 2021-08-16 LAB — VITAMIN D 25 HYDROXY (VIT D DEFICIENCY, FRACTURES): VITD: 46.57 ng/mL (ref 30.00–100.00)

## 2021-08-16 NOTE — Progress Notes (Signed)
Established Patient Office Visit  Subjective:  Patient ID: Heidi Sanchez, female    DOB: [birth year]  Age: 80 y.o. MRN: [id number]  CC:  Chief Complaint  Patient presents with   Follow-up    1 mo follow up pt has brought bp, pt is fasting, pt has had flu shot      HPI Heidi Sanchez presents for follow-up of hypertension, elevated cholesterol, vitamin D deficiency.  Patient has been stressed recently.  Fortunately there was good news that came from 68 breast biopsy.  No cancer.  She has been released by the dermatologist status post Mohs surgery on her nose.  Blood pressure before increasing the Accupril was in the 140s over 80s range.  She did not take her blood pressure medicine this morning to be fasting for lab work.  She is having no problems taking the higher dose.  With everything going on he has not checked her blood pressure since we increased the dose.  Past Medical History:  Diagnosis Date   Breast cancer ([place])    Cancer ([place])    breast   Colonic polyp    Hyperlipidemia    Hypertension    Personal history of radiation therapy     Past Surgical History:  Procedure Laterality Date   BREAST BIOPSY     BREAST LUMPECTOMY Right    1998    Family History  Problem Relation Age of Onset   Cancer Mother    Cancer Father     Social History   Socioeconomic History   Marital status: Married    Spouse name: Not on file   Number of children: Not on file   Years of education: Not on file   Highest education level: Not on file  Occupational History   Not on file  Tobacco Use   Smoking status: Never   Smokeless tobacco: Never  Substance and Sexual Activity   Alcohol use: No   Drug use: No   Sexual activity: Yes  Other Topics Concern   Not on file  Social History Narrative   HPOA is her husband, Torrie Namba.     She would want CPR- no feeding tube or "heroic measures."   Social Determinants of Health   Financial Resource Strain: Low Risk    Difficulty of  Paying Living Expenses: Not hard at all  Food Insecurity: No Food Insecurity   Worried About Charity fundraiser in the Last Year: Never true   [place] in the Last Year: Never true  Transportation Needs: No Transportation Needs   Lack of Transportation (Medical): No   Lack of Transportation (Non-Medical): No  Physical Activity: Sufficiently Active   Days of Exercise per Week: 5 days   Minutes of Exercise per Session: 40 min  Stress: No Stress Concern Present   Feeling of Stress : Not at all  Social Connections: Moderately Integrated   Frequency of Communication with Friends and Family: More than three times a week   Frequency of Social Gatherings with Friends and Family: Twice a week   Attends Religious Services: More than 4 times per year   Active Member of [employer] or Organizations: No   Attends Archivist Meetings: Never   Marital Status: Married  Human resources officer Violence: Not At Risk   Fear of Current or Ex-Partner: No   Emotionally Abused: No   Physically Abused: No   Sexually Abused: No    Outpatient Medications Prior to Visit  Medication Sig Dispense Refill   amLODipine (NORVASC) 5 MG tablet TAKE 1 TABLET (5 MG TOTAL) BY MOUTH DAILY. PLEASE SCHEDULE VISIT WITH NEW PROVIDER 90 tablet 0   Cholecalciferol (VITAMIN D) 50 MCG (2000 UT) CAPS Take 2,000 Units by mouth daily.     diphenhydrAMINE (BENADRYL) 25 MG tablet Take 25 mg by mouth as needed.     fluorouracil (EFUDEX) 5 % cream Apply topically 2 (two) times daily.     fluticasone (FLONASE) 50 MCG/ACT nasal spray SPRAY 2 SPRAYS INTO EACH NOSTRIL EVERY DAY 16 mL 6   Multiple Vitamins-Minerals (OCUVITE PRESERVISION) TABS 1 tablet 2 (two) times daily. Use as directed     Omega-3 Fatty Acids (FISH OIL) 1000 MG CAPS Take 1 capsule (1,000 mg total) by mouth daily. 90 capsule 3   quinapril (ACCUPRIL) 10 MG tablet Take 1 tablet (10 mg total) by mouth daily. 90 tablet 0   simvastatin (ZOCOR) 40 MG tablet TAKE 1 TABLET  BY MOUTH EVERYDAY AT BEDTIME 90 tablet 0   No facility-administered medications prior to visit.    Allergies  Allergen Reactions   Codeine     REACTION: Nigthmares    ROS Review of Systems  Constitutional:  Negative for chills, diaphoresis, fatigue, fever and unexpected weight change.  HENT: Negative.    Eyes:  Positive for visual disturbance. Negative for photophobia.  Respiratory: Negative.    Cardiovascular: Negative.   Gastrointestinal: Negative.   Endocrine: Negative for polyphagia and polyuria.  Genitourinary:  Negative for difficulty urinating, frequency and urgency.  Musculoskeletal:  Negative for gait problem and joint swelling.  Neurological:  Negative for speech difficulty, weakness, light-headedness and headaches.  Psychiatric/Behavioral: Negative.       Objective:    Physical Exam Vitals and nursing note reviewed.  Constitutional:      General: She is not in acute distress.    Appearance: Normal appearance. She is not ill-appearing, toxic-appearing or diaphoretic.  HENT:     Head: Normocephalic and atraumatic.     Right Ear: Tympanic membrane, ear canal and external ear normal.     Left Ear: Tympanic membrane, ear canal and external ear normal.     Mouth/Throat:     Mouth: Mucous membranes are dry.     Pharynx: Oropharynx is clear. No oropharyngeal exudate or posterior oropharyngeal erythema.  Eyes:     General: No scleral icterus.       Right eye: No discharge.        Left eye: No discharge.     Extraocular Movements: Extraocular movements intact.     Conjunctiva/sclera: Conjunctivae normal.     Pupils: Pupils are equal, round, and reactive to light.  Cardiovascular:     Rate and Rhythm: Normal rate and regular rhythm.  Pulmonary:     Effort: Pulmonary effort is normal.     Breath sounds: Normal breath sounds.  Abdominal:     General: Bowel sounds are normal.  Musculoskeletal:     Right lower leg: No edema.     Left lower leg: No edema.   Neurological:     Mental Status: She is alert and oriented to person, place, and time.  Psychiatric:        Mood and Affect: Mood normal.        Behavior: Behavior normal.    BP (!) 180/90 (BP Location: Left Arm, Patient Position: Sitting, Cuff Size: Normal)   Pulse 75   Temp 97.7 F (36.5 C) (Temporal)   Resp 18  Wt 144 lb 6.4 oz (65.5 kg)   SpO2 99%   BMI 25.58 kg/m  Wt Readings from Last 3 Encounters:  08/16/21 144 lb 6.4 oz (65.5 kg)  07/01/21 147 lb 3.2 oz (66.8 kg)  12/28/20 151 lb 12.8 oz (68.9 kg)     Health Maintenance Due  Topic Date Due   TETANUS/TDAP  02/12/2019    There are no preventive care reminders to display for this patient.  Lab Results  Component Value Date   TSH 1.35 06/03/2019   Lab Results  Component Value Date   WBC 6.5 12/28/2020   HGB 13.8 12/28/2020   HCT 40.9 12/28/2020   MCV 96.5 12/28/2020   PLT 222.0 12/28/2020   Lab Results  Component Value Date   NA 142 12/28/2020   K 4.3 12/28/2020   CO2 30 12/28/2020   GLUCOSE 90 12/28/2020   BUN 13 12/28/2020   CREATININE 0.77 12/28/2020   BILITOT 0.7 12/28/2020   ALKPHOS 62 12/28/2020   AST 14 12/28/2020   ALT 15 12/28/2020   PROT 6.9 12/28/2020   ALBUMIN 4.2 12/28/2020   CALCIUM 10.4 12/28/2020   GFR 71.95 12/28/2020   Lab Results  Component Value Date   CHOL 125 12/26/2019   Lab Results  Component Value Date   HDL 25.00 (L) 12/26/2019   Lab Results  Component Value Date   LDLCALC 62 12/26/2019   Lab Results  Component Value Date   TRIG 190.0 (H) 12/26/2019   Lab Results  Component Value Date   CHOLHDL 5 12/26/2019   No results found for: HGBA1C    Assessment & Plan:   Problem List Items Addressed This Visit       Cardiovascular and Mediastinum   Essential hypertension - Primary   Relevant Orders   Urinalysis, Routine w reflex microscopic   CBC   Comprehensive metabolic panel     Other   Vitamin D deficiency   Relevant Orders   VITAMIN D 25  Hydroxy (Vit-D Deficiency, Fractures)   Pure hypercholesterolemia   Relevant Orders   Lipid panel   Comprehensive metabolic panel    No orders of the defined types were placed in this encounter.   Follow-up: Return in about 2 months (around 10/16/2021).   Patient's blood pressure cuff was close to our reading here at the clinic.  He will start to check and record her blood pressure and let me know where they are running.  Advised that we are looking for pressures averaging less than 140/90.  Rechecking cholesterol and vitamin D.   Libby Maw, MD

## 2021-08-17 ENCOUNTER — Other Ambulatory Visit: Payer: Medicare Other

## 2021-09-23 ENCOUNTER — Other Ambulatory Visit: Payer: Self-pay | Admitting: Family Medicine

## 2021-09-23 DIAGNOSIS — I10 Essential (primary) hypertension: Secondary | ICD-10-CM

## 2021-09-23 NOTE — Telephone Encounter (Signed)
Chart supports rx refill Last ov: 08/16/2021 Last refill: 06/28/2021

## 2021-09-29 ENCOUNTER — Other Ambulatory Visit: Payer: Self-pay | Admitting: Family Medicine

## 2021-09-29 DIAGNOSIS — I10 Essential (primary) hypertension: Secondary | ICD-10-CM

## 2021-10-18 ENCOUNTER — Ambulatory Visit (INDEPENDENT_AMBULATORY_CARE_PROVIDER_SITE_OTHER): Payer: Medicare Other | Admitting: Family Medicine

## 2021-10-18 ENCOUNTER — Other Ambulatory Visit: Payer: Self-pay

## 2021-10-18 ENCOUNTER — Encounter: Payer: Self-pay | Admitting: Family Medicine

## 2021-10-18 VITALS — BP 148/74 | HR 73 | Temp 97.5°F | Ht 63.0 in | Wt 145.8 lb

## 2021-10-18 DIAGNOSIS — I10 Essential (primary) hypertension: Secondary | ICD-10-CM | POA: Diagnosis not present

## 2021-10-18 DIAGNOSIS — H547 Unspecified visual loss: Secondary | ICD-10-CM

## 2021-10-18 DIAGNOSIS — E7800 Pure hypercholesterolemia, unspecified: Secondary | ICD-10-CM

## 2021-10-18 MED ORDER — AMLODIPINE BESYLATE 5 MG PO TABS: ORAL_TABLET | ORAL | 2 refills | Status: DC

## 2021-10-18 MED ORDER — QUINAPRIL HCL 10 MG PO TABS: 10.0000 mg | ORAL_TABLET | Freq: Every day | ORAL | 2 refills | Status: DC

## 2021-10-18 MED ORDER — SIMVASTATIN 40 MG PO TABS: ORAL_TABLET | ORAL | 3 refills | Status: DC

## 2021-10-18 NOTE — Progress Notes (Signed)
Established Patient Office Visit  Subjective:  Patient ID: Heidi Sanchez, female    DOB: 1940/10/06  Age: 81 y.o. MRN: [id number]  CC:  Chief Complaint  Patient presents with   Follow-up    Follow up on BP, no concerns.     HPI [name] presents for follow-up of hypertension and elevated cholesterol.  She continues to take simvastatin without issue.  Blood pressures been well controlled with amlodipine and quinapril.  She is having no issues taking these medications.  She brings in an extensive list of blood pressures that are mostly in the 130/80 range.  There are some elevated outliers when she forgot to take her medications.  She denies headaches blurred vision difficulty breathing.  Denies cough.  Past Medical History:  Diagnosis Date   Breast cancer ([place])    Cancer ([place])    breast   Colonic polyp    Hyperlipidemia    Hypertension    Personal history of radiation therapy     Past Surgical History:  Procedure Laterality Date   BREAST BIOPSY     BREAST LUMPECTOMY Right    1998    Family History  Problem Relation Age of Onset   Cancer Mother    Cancer Father     Social History   Socioeconomic History   Marital status: Married    Spouse name: Not on file   Number of children: Not on file   Years of education: Not on file   Highest education level: Not on file  Occupational History   Not on file  Tobacco Use   Smoking status: Never   Smokeless tobacco: Never  Substance and Sexual Activity   Alcohol use: No   Drug use: No   Sexual activity: Yes  Other Topics Concern   Not on file  Social History Narrative   HPOA is her husband, Ahtziry Saathoff.     She would want CPR- no feeding tube or "heroic measures."   Social Determinants of Health   Financial Resource Strain: Low Risk    Difficulty of Paying Living Expenses: Not hard at all  Food Insecurity: No Food Insecurity   Worried About Charity fundraiser in the Last Year: Never true   [place] in the Last Year: Never true  Transportation Needs: No Transportation Needs   Lack of Transportation (Medical): No   Lack of Transportation (Non-Medical): No  Physical Activity: Sufficiently Active   Days of Exercise per Week: 5 days   Minutes of Exercise per Session: 40 min  Stress: No Stress Concern Present   Feeling of Stress : Not at all  Social Connections: Moderately Integrated   Frequency of Communication with Friends and Family: More than three times a week   Frequency of Social Gatherings with Friends and Family: Twice a week   Attends Religious Services: More than 4 times per year   Active Member of [employer] or Organizations: No   Attends Archivist Meetings: Never   Marital Status: Married  Human resources officer Violence: Not At Risk   Fear of Current or Ex-Partner: No   Emotionally Abused: No   Physically Abused: No   Sexually Abused: No    Outpatient Medications Prior to Visit  Medication Sig Dispense Refill   Cholecalciferol (VITAMIN D) 50 MCG (2000 UT) CAPS Take 2,000 Units by mouth daily.     diphenhydrAMINE (BENADRYL) 25 MG tablet Take 25 mg by mouth as needed.  fluorouracil (EFUDEX) 5 % cream Apply topically 2 (two) times daily.     fluticasone (FLONASE) 50 MCG/ACT nasal spray SPRAY 2 SPRAYS INTO EACH NOSTRIL EVERY DAY 16 mL 6   Multiple Vitamins-Minerals (OCUVITE PRESERVISION) TABS 1 tablet 2 (two) times daily. Use as directed     Omega-3 Fatty Acids (FISH OIL) 1000 MG CAPS Take 1 capsule (1,000 mg total) by mouth daily. 90 capsule 3   amLODipine (NORVASC) 5 MG tablet TAKE 1 TABLET (5 MG TOTAL) BY MOUTH DAILY. PLEASE SCHEDULE VISIT WITH NEW PROVIDER 90 tablet 0   quinapril (ACCUPRIL) 10 MG tablet TAKE 1 TABLET BY MOUTH EVERY DAY 90 tablet 0   simvastatin (ZOCOR) 40 MG tablet TAKE 1 TABLET BY MOUTH EVERYDAY AT BEDTIME 90 tablet 0   No facility-administered medications prior to visit.    Allergies  Allergen Reactions   Codeine     REACTION:  Nigthmares    ROS Review of Systems  Constitutional:  Negative for diaphoresis, fatigue, fever and unexpected weight change.  HENT: Negative.    Eyes:  Positive for visual disturbance. Negative for photophobia.  Respiratory: Negative.    Cardiovascular: Negative.   Gastrointestinal: Negative.   Endocrine: Negative for polyphagia and polyuria.  Genitourinary: Negative.   Musculoskeletal:  Negative for gait problem and joint swelling.  Neurological:  Negative for dizziness, speech difficulty, weakness, light-headedness and headaches.  Psychiatric/Behavioral: Negative.       Objective:    Physical Exam Vitals and nursing note reviewed.  Constitutional:      General: She is not in acute distress.    Appearance: Normal appearance. She is normal weight. She is not ill-appearing, toxic-appearing or diaphoretic.  HENT:     Head: Normocephalic and atraumatic.     Right Ear: Tympanic membrane, ear canal and external ear normal.     Left Ear: Tympanic membrane, ear canal and external ear normal.     Mouth/Throat:     Mouth: Mucous membranes are moist.     Pharynx: Oropharynx is clear. No oropharyngeal exudate or posterior oropharyngeal erythema.  Eyes:     General: No scleral icterus.       Right eye: No discharge.        Left eye: No discharge.     Extraocular Movements: Extraocular movements intact.     Conjunctiva/sclera: Conjunctivae normal.     Pupils: Pupils are equal, round, and reactive to light.  Cardiovascular:     Rate and Rhythm: Normal rate and regular rhythm.  Pulmonary:     Effort: Pulmonary effort is normal.     Breath sounds: Normal breath sounds.  Abdominal:     General: Bowel sounds are normal.  Musculoskeletal:     Cervical back: No rigidity or tenderness.     Right lower leg: No edema.     Left lower leg: No edema.  Lymphadenopathy:     Cervical: No cervical adenopathy.  Skin:    General: Skin is warm and dry.  Neurological:     Mental Status: She is  alert and oriented to person, place, and time.  Psychiatric:        Mood and Affect: Mood normal.        Behavior: Behavior normal.    BP (!) 148/74 (BP Location: Left Arm, Patient Position: Sitting, Cuff Size: Normal)    Pulse 73    Temp (!) 97.5 F (36.4 C) (Temporal)    Ht 5\' 3"  (1.6 m)    Wt 145 lb 12.8  oz (66.1 kg)    SpO2 98%    BMI 25.83 kg/m  Wt Readings from Last 3 Encounters:  10/18/21 145 lb 12.8 oz (66.1 kg)  08/16/21 144 lb 6.4 oz (65.5 kg)  07/01/21 147 lb 3.2 oz (66.8 kg)     Health Maintenance Due  Topic Date Due   TETANUS/TDAP  02/12/2019    There are no preventive care reminders to display for this patient.  Lab Results  Component Value Date   TSH 1.35 06/03/2019   Lab Results  Component Value Date   WBC 6.3 08/16/2021   HGB 14.4 08/16/2021   HCT 43.5 08/16/2021   MCV 96.7 08/16/2021   PLT 210.0 08/16/2021   Lab Results  Component Value Date   NA 145 08/16/2021   K 4.9 08/16/2021   CO2 30 08/16/2021   GLUCOSE 102 (H) 08/16/2021   BUN 13 08/16/2021   CREATININE 0.87 08/16/2021   BILITOT 0.9 08/16/2021   ALKPHOS 73 08/16/2021   AST 19 08/16/2021   ALT 23 08/16/2021   PROT 7.2 08/16/2021   ALBUMIN 4.6 08/16/2021   CALCIUM 10.7 (H) 08/16/2021   GFR 61.87 08/16/2021   Lab Results  Component Value Date   CHOL 153 08/16/2021   Lab Results  Component Value Date   HDL 34.00 (L) 08/16/2021   Lab Results  Component Value Date   LDLCALC 98 08/16/2021   Lab Results  Component Value Date   TRIG 105.0 08/16/2021   Lab Results  Component Value Date   CHOLHDL 5 08/16/2021   No results found for: HGBA1C    Assessment & Plan:   Problem List Items Addressed This Visit       Cardiovascular and Mediastinum   Essential hypertension - Primary   Relevant Medications   amLODipine (NORVASC) 5 MG tablet   quinapril (ACCUPRIL) 10 MG tablet   simvastatin (ZOCOR) 40 MG tablet     Other   Pure hypercholesterolemia   Relevant Medications    amLODipine (NORVASC) 5 MG tablet   quinapril (ACCUPRIL) 10 MG tablet   simvastatin (ZOCOR) 40 MG tablet   Impaired vision    Meds ordered this encounter  Medications   amLODipine (NORVASC) 5 MG tablet    Sig: TAKE 1 TABLET (5 MG TOTAL) BY MOUTH DAILY. PLEASE SCHEDULE VISIT WITH NEW PROVIDER    Dispense:  90 tablet    Refill:  2   quinapril (ACCUPRIL) 10 MG tablet    Sig: Take 1 tablet (10 mg total) by mouth daily.    Dispense:  90 tablet    Refill:  2   simvastatin (ZOCOR) 40 MG tablet    Sig: TAKE 1 TABLET BY MOUTH EVERYDAY AT BEDTIME    Dispense:  90 tablet    Refill:  3    Follow-up: Return in about 6 months (around 04/17/2022).  Continue current medications.  Information was given on managing hypertension.  Libby Maw, MD

## 2021-10-22 DIAGNOSIS — H353134 Nonexudative age-related macular degeneration, bilateral, advanced atrophic with subfoveal involvement: Secondary | ICD-10-CM | POA: Diagnosis not present

## 2022-01-02 ENCOUNTER — Other Ambulatory Visit: Payer: Self-pay | Admitting: Family Medicine

## 2022-01-14 ENCOUNTER — Other Ambulatory Visit: Payer: Self-pay | Admitting: Family Medicine

## 2022-01-14 DIAGNOSIS — R921 Mammographic calcification found on diagnostic imaging of breast: Secondary | ICD-10-CM

## 2022-01-14 DIAGNOSIS — I10 Essential (primary) hypertension: Secondary | ICD-10-CM

## 2022-02-14 ENCOUNTER — Ambulatory Visit: Payer: Medicare Other

## 2022-02-14 ENCOUNTER — Ambulatory Visit
Admission: RE | Admit: 2022-02-14 | Discharge: 2022-02-14 | Disposition: A | Discharge status: Home / Self Care | Payer: Medicare Other | Source: Ambulatory Visit | Attending: Family Medicine | Admitting: Family Medicine

## 2022-02-14 DIAGNOSIS — R921 Mammographic calcification found on diagnostic imaging of breast: Secondary | ICD-10-CM

## 2022-02-14 DIAGNOSIS — Z853 Personal history of malignant neoplasm of breast: Secondary | ICD-10-CM | POA: Diagnosis not present

## 2022-02-14 DIAGNOSIS — R928 Other abnormal and inconclusive findings on diagnostic imaging of breast: Secondary | ICD-10-CM | POA: Diagnosis not present

## 2022-04-18 ENCOUNTER — Ambulatory Visit (INDEPENDENT_AMBULATORY_CARE_PROVIDER_SITE_OTHER): Payer: Medicare Other | Admitting: Family Medicine

## 2022-04-18 ENCOUNTER — Encounter: Payer: Self-pay | Admitting: Family Medicine

## 2022-04-18 VITALS — BP 148/76 | HR 75 | Temp 97.3°F | Ht 63.0 in | Wt 143.4 lb

## 2022-04-18 DIAGNOSIS — W57XXXA Bitten or stung by nonvenomous insect and other nonvenomous arthropods, initial encounter: Secondary | ICD-10-CM

## 2022-04-18 DIAGNOSIS — B356 Tinea cruris: Secondary | ICD-10-CM | POA: Insufficient documentation

## 2022-04-18 DIAGNOSIS — I10 Essential (primary) hypertension: Secondary | ICD-10-CM | POA: Diagnosis not present

## 2022-04-18 DIAGNOSIS — E782 Mixed hyperlipidemia: Secondary | ICD-10-CM

## 2022-04-18 DIAGNOSIS — S30861A Insect bite (nonvenomous) of abdominal wall, initial encounter: Secondary | ICD-10-CM | POA: Diagnosis not present

## 2022-04-18 LAB — COMPREHENSIVE METABOLIC PANEL
ALT: 15 U/L (ref 0–35)
AST: 19 U/L (ref 0–37)
Albumin: 4.5 g/dL (ref 3.5–5.2)
Alkaline Phosphatase: 63 U/L (ref 39–117)
BUN: 12 mg/dL (ref 6–23)
CO2: 28 mEq/L (ref 19–32)
Calcium: 10.8 mg/dL — ABNORMAL HIGH (ref 8.4–10.5)
Chloride: 106 mEq/L (ref 96–112)
Creatinine, Ser: 0.85 mg/dL (ref 0.40–1.20)
GFR: 63.33 mL/min (ref >60.00)
Glucose, Bld: 99 mg/dL (ref 70–99)
Potassium: 4.7 mEq/L (ref 3.5–5.1)
Sodium: 142 mEq/L (ref 135–145)
Total Bilirubin: 0.8 mg/dL (ref 0.2–1.2)
Total Protein: 7.4 g/dL (ref 6.0–8.3)

## 2022-04-18 LAB — LIPID PANEL
Cholesterol: 128 mg/dL (ref 0–200)
HDL: 29.3 mg/dL — ABNORMAL LOW (ref >39.00)
LDL Cholesterol: 72 mg/dL (ref 0–99)
NonHDL: 98.22
Total CHOL/HDL Ratio: 4
Triglycerides: 131 mg/dL (ref 0.0–149.0)
VLDL: 26.2 mg/dL (ref 0.0–40.0)

## 2022-04-18 MED ORDER — CLOTRIMAZOLE 1 % EX CREA: 1.0000 | TOPICAL_CREAM | Freq: Two times a day (BID) | CUTANEOUS | 0 refills | Status: DC

## 2022-04-18 NOTE — Progress Notes (Signed)
Established Patient Office Visit  Subjective   Patient ID: Heidi Sanchez, female    DOB: [birth year]  Age: 81 y.o. MRN: [id number]  Chief Complaint  Patient presents with   Follow-up    6 month follow up, concerns about lisinopril and strength, check tick bit on left side, rash on buttocks have come back need refill on cream. Patient fasting.     HPI follow-up of hypertension, mixed hyperlipidemia, tick bite and rash in groin area.  They exercise by walking and working in the yard.  She has a pruritic rash in the groin area.  They are treating it with Neosporin.  Was bitten by a tick in her left lateral belt line 2 and half weeks ago.  Denies fevers chills headache, rash on her hands and feet.  Brings in an extensive list of blood pressures in the 120/70 range.  Machine has been calibrated in this clinic.  Continues with lisinopril and amlodipine for hypertension.  Continues with Zocor for treatment of elevated cholesterol    Review of Systems  Constitutional:  Negative for chills, diaphoresis, malaise/fatigue and weight loss.  HENT: Negative.    Eyes: Negative.  Negative for blurred vision and double vision.  Cardiovascular:  Negative for chest pain.  Gastrointestinal:  Negative for abdominal pain.  Genitourinary: Negative.   Musculoskeletal:  Negative for falls, joint pain and myalgias.  Skin:  Positive for rash. Negative for itching.  Neurological:  Negative for dizziness, speech change, loss of consciousness, weakness and headaches.  Psychiatric/Behavioral: Negative.        Objective:     BP (!) 148/76 (BP Location: Right Arm, Patient Position: Sitting, Cuff Size: Normal)   Pulse 75   Temp (!) 97.3 F (36.3 C) (Temporal)   Ht '5\' 3"'$  (1.6 m)   Wt 143 lb 6.4 oz (65 kg)   SpO2 98%   BMI 25.40 kg/m  BP Readings from Last 3 Encounters:  04/18/22 (!) 148/76  10/18/21 (!) 148/74  08/16/21 (!) 180/90   Wt Readings from Last 3 Encounters:  04/18/22 143 lb 6.4 oz (65 kg)   10/18/21 145 lb 12.8 oz (66.1 kg)  08/16/21 144 lb 6.4 oz (65.5 kg)      Physical Exam Constitutional:      General: She is not in acute distress.    Appearance: Normal appearance. She is not ill-appearing, toxic-appearing or diaphoretic.  HENT:     Head: Normocephalic and atraumatic.     Right Ear: External ear normal.     Left Ear: External ear normal.     Mouth/Throat:     Mouth: Mucous membranes are moist.     Pharynx: Oropharynx is clear. No oropharyngeal exudate or posterior oropharyngeal erythema.  Eyes:     General: No scleral icterus.       Right eye: No discharge.        Left eye: No discharge.     Extraocular Movements: Extraocular movements intact.     Conjunctiva/sclera: Conjunctivae normal.     Pupils: Pupils are equal, round, and reactive to light.  Cardiovascular:     Rate and Rhythm: Normal rate and regular rhythm.  Pulmonary:     Effort: Pulmonary effort is normal. No respiratory distress.     Breath sounds: Normal breath sounds.  Abdominal:     General: Bowel sounds are normal.     Tenderness: There is no abdominal tenderness. There is no guarding.  Musculoskeletal:     Cervical back: No rigidity  or tenderness.  Skin:    General: Skin is warm and dry.       Neurological:     Mental Status: She is alert and oriented to person, place, and time.  Psychiatric:        Mood and Affect: Mood normal.        Behavior: Behavior normal.      No results found for any visits on 04/18/22.    The ASCVD Risk score (Arnett DK, et al., 2019) failed to calculate for the following reasons:   The 2019 ASCVD risk score is only valid for ages 75 to 62    Assessment & Plan:   Problem List Items Addressed This Visit       Cardiovascular and Mediastinum   White coat syndrome with diagnosis of hypertension - Primary   Relevant Orders   Comprehensive metabolic panel     Musculoskeletal and Integument   Tinea cruris   Relevant Medications   clotrimazole  (LOTRIMIN AF) 1 % cream   Tick bite of abdominal wall     Other   HLD (hyperlipidemia)   Relevant Orders   Lipid panel   Comprehensive metabolic panel    Return in about 6 months (around 10/19/2022), or if symptoms worsen or fail to improve.  Continue Zocor, lisinopril and amlodipine.  Apply Lotrimin cream twice daily for 10 to 14 days.  Try to keep affected area as dry as possible.  Discussed strategies.  Return if rash at tick bite site enlarges or she develops rash on her hands or feet with fever chills headache.  Libby Maw, MD

## 2022-05-17 ENCOUNTER — Ambulatory Visit (INDEPENDENT_AMBULATORY_CARE_PROVIDER_SITE_OTHER): Payer: Medicare Other

## 2022-05-17 DIAGNOSIS — Z0000 Encounter for general adult medical examination without abnormal findings: Secondary | ICD-10-CM | POA: Diagnosis not present

## 2022-05-17 NOTE — Patient Instructions (Signed)
Heidi Sanchez , Thank you for taking time to come for your Medicare Wellness Visit. I appreciate your ongoing commitment to your health goals. Please review the following plan we discussed and let me know if I can assist you in the future.   Screening recommendations/referrals: Colonoscopy: no longer required Mammogram: no longer required Bone Density: 06/05/2019 Recommended yearly ophthalmology/optometry visit for glaucoma screening and checkup Recommended yearly dental visit for hygiene and checkup  Vaccinations: Influenza vaccine: completed  Pneumococcal vaccine: completed  Tdap vaccine: due  Shingles vaccine: completed     Advanced directives: yes  Conditions/risks identified: none   Next appointment: none    Preventive Care 70 Years and Older, Female Preventive care refers to lifestyle choices and visits with your health care provider that can promote health and wellness. What does preventive care include? A yearly physical exam. This is also called an annual well check. Dental exams once or twice a year. Routine eye exams. Ask your health care provider how often you should have your eyes checked. Personal lifestyle choices, including: Daily care of your teeth and gums. Regular physical activity. Eating a healthy diet. Avoiding tobacco and drug use. Limiting alcohol use. Practicing safe sex. Taking low-dose aspirin every day. Taking vitamin and mineral supplements as recommended by your health care provider. What happens during an annual well check? The services and screenings done by your health care provider during your annual well check will depend on your age, overall health, lifestyle risk factors, and family history of disease. Counseling  Your health care provider may ask you questions about your: Alcohol use. Tobacco use. Drug use. Emotional well-being. Home and relationship well-being. Sexual activity. Eating habits. History of falls. Memory and ability to  understand (cognition). Work and work Statistician. Reproductive health. Screening  You may have the following tests or measurements: Height, weight, and BMI. Blood pressure. Lipid and cholesterol levels. These may be checked every 5 years, or more frequently if you are over 58 years old. Skin check. Lung cancer screening. You may have this screening every year starting at age 53 if you have a 30-pack-year history of smoking and currently smoke or have quit within the past 15 years. Fecal occult blood test (FOBT) of the stool. You may have this test every year starting at age 25. Flexible sigmoidoscopy or colonoscopy. You may have a sigmoidoscopy every 5 years or a colonoscopy every 10 years starting at age 64. Hepatitis C blood test. Hepatitis B blood test. Sexually transmitted disease (STD) testing. Diabetes screening. This is done by checking your blood sugar (glucose) after you have not eaten for a while (fasting). You may have this done every 1-3 years. Bone density scan. This is done to screen for osteoporosis. You may have this done starting at age 19. Mammogram. This may be done every 1-2 years. Talk to your health care provider about how often you should have regular mammograms. Talk with your health care provider about your test results, treatment options, and if necessary, the need for more tests. Vaccines  Your health care provider may recommend certain vaccines, such as: Influenza vaccine. This is recommended every year. Tetanus, diphtheria, and acellular pertussis (Tdap, Td) vaccine. You may need a Td booster every 10 years. Zoster vaccine. You may need this after age 14. Pneumococcal 13-valent conjugate (PCV13) vaccine. One dose is recommended after age 2. Pneumococcal polysaccharide (PPSV23) vaccine. One dose is recommended after age 33. Talk to your health care provider about which screenings and vaccines you need  and how often you need them. This information is not  intended to replace advice given to you by your health care provider. Make sure you discuss any questions you have with your health care provider. Document Released: 10/16/2015 Document Revised: 06/08/2016 Document Reviewed: 07/21/2015 Elsevier Interactive Patient Education  2017 [employer] Prevention in the Home Falls can cause injuries. They can happen to people of all ages. There are many things you can do to make your home safe and to help prevent falls. What can I do on the outside of my home? Regularly fix the edges of walkways and driveways and fix any cracks. Remove anything that might make you trip as you walk through a door, such as a raised step or threshold. Trim any bushes or trees on the path to your home. Use bright outdoor lighting. Clear any walking paths of anything that might make someone trip, such as rocks or tools. Regularly check to see if handrails are loose or broken. Make sure that both sides of any steps have handrails. Any raised decks and porches should have guardrails on the edges. Have any leaves, snow, or ice cleared regularly. Use sand or salt on walking paths during winter. Clean up any spills in your garage right away. This includes oil or grease spills. What can I do in the bathroom? Use night lights. Install grab bars by the toilet and in the tub and shower. Do not use towel bars as grab bars. Use non-skid mats or decals in the tub or shower. If you need to sit down in the shower, use a plastic, non-slip stool. Keep the floor dry. Clean up any water that spills on the floor as soon as it happens. Remove soap buildup in the tub or shower regularly. Attach bath mats securely with double-sided non-slip rug tape. Do not have throw rugs and other things on the floor that can make you trip. What can I do in the bedroom? Use night lights. Make sure that you have a light by your bed that is easy to reach. Do not use any Dacy or blankets that are  too big for your bed. They should not hang down onto the floor. Have a firm chair that has side arms. You can use this for support while you get dressed. Do not have throw rugs and other things on the floor that can make you trip. What can I do in the kitchen? Clean up any spills right away. Avoid walking on wet floors. Keep items that you use a lot in easy-to-reach places. If you need to reach something above you, use a strong step stool that has a grab bar. Keep electrical cords out of the way. Do not use floor polish or wax that makes floors slippery. If you must use wax, use non-skid floor wax. Do not have throw rugs and other things on the floor that can make you trip. What can I do with my stairs? Do not leave any items on the stairs. Make sure that there are handrails on both sides of the stairs and use them. Fix handrails that are broken or loose. Make sure that handrails are as long as the stairways. Check any carpeting to make sure that it is firmly attached to the stairs. Fix any carpet that is loose or worn. Avoid having throw rugs at the top or bottom of the stairs. If you do have throw rugs, attach them to the floor with carpet tape. Make sure that you  have a light switch at the top of the stairs and the bottom of the stairs. If you do not have them, ask someone to add them for you. What else can I do to help prevent falls? Wear shoes that: Do not have high heels. Have rubber bottoms. Are comfortable and fit you well. Are closed at the toe. Do not wear sandals. If you use a stepladder: Make sure that it is fully opened. Do not climb a closed stepladder. Make sure that both sides of the stepladder are locked into place. Ask someone to hold it for you, if possible. Clearly mark and make sure that you can see: Any grab bars or handrails. First and last steps. Where the edge of each step is. Use tools that help you move around (mobility aids) if they are needed. These  include: Canes. Walkers. Scooters. Crutches. Turn on the lights when you go into a dark area. Replace any light bulbs as soon as they burn out. Set up your furniture so you have a clear path. Avoid moving your furniture around. If any of your floors are uneven, fix them. If there are any pets around you, be aware of where they are. Review your medicines with your doctor. Some medicines can make you feel dizzy. This can increase your chance of falling. Ask your doctor what other things that you can do to help prevent falls. This information is not intended to replace advice given to you by your health care provider. Make sure you discuss any questions you have with your health care provider. Document Released: 07/16/2009 Document Revised: 02/25/2016 Document Reviewed: 10/24/2014 Elsevier Interactive Patient Education  2017 [employer].

## 2022-05-17 NOTE — Progress Notes (Signed)
Subjective:   Heidi SWARTZENDRUBER is a 81 y.o. female who presents for Medicare Annual (Subsequent) preventive examination.   I connected with Heidi Sanchez  today by telephone and verified that I am speaking with the correct person using two identifiers. Location patient: home Location provider: work Persons participating in the virtual visit: patient, provider.   I discussed the limitations, risks, security and privacy concerns of performing an evaluation and management service by telephone and the availability of in person appointments. I also discussed with the patient that there may be a patient responsible charge related to this service. The patient expressed understanding and verbally consented to this telephonic visit.    Interactive audio and video telecommunications were attempted between this provider and patient, however failed, due to patient having technical difficulties OR patient did not have access to video capability.  We continued and completed visit with audio only.    Review of Systems     Cardiac Risk Factors include: advanced age (>48mn, >>46women)     Objective:    Today's Vitals   There is no height or weight on file to calculate BMI.     05/17/2022   10:36 AM 05/11/2021    9:43 AM 04/29/2020    9:24 AM 04/24/2019    2:24 PM 09/15/2016    9:25 AM  Advanced Directives  Does Patient Have a Medical Advance Directive? Yes No Yes Yes Yes  Type of AParamedicof [place] will  [place] will [place] will [place] will  Does patient want to make changes to medical advance directive?    No - Patient declined   Copy of HElyin Chart? No - copy requested  No - copy requested No - copy requested No - copy requested  Would patient like information on creating a medical advance directive?  No - Patient declined       Current Medications  (verified) Outpatient Encounter Medications as of 05/17/2022  Medication Sig   amLODipine (NORVASC) 5 MG tablet TAKE 1 TABLET (5 MG TOTAL) BY MOUTH DAILY. PLEASE SCHEDULE VISIT WITH NEW PROVIDER   Cholecalciferol (VITAMIN D) 50 MCG (2000 UT) CAPS Take 2,000 Units by mouth daily.   clotrimazole (LOTRIMIN AF) 1 % cream Apply 1 Application topically 2 (two) times daily. For 10 days.   diphenhydrAMINE (BENADRYL) 25 MG tablet Take 25 mg by mouth as needed.   fluorouracil (EFUDEX) 5 % cream Apply topically 2 (two) times daily.   fluticasone (FLONASE) 50 MCG/ACT nasal spray SPRAY 2 SPRAYS INTO EACH NOSTRIL EVERY DAY   lisinopril (ZESTRIL) 20 MG tablet Take 1 tablet (20 mg total) by mouth daily.   Multiple Vitamins-Minerals (OCUVITE PRESERVISION) TABS 1 tablet 2 (two) times daily. Use as directed   Omega-3 Fatty Acids (FISH OIL) 1000 MG CAPS Take 1 capsule (1,000 mg total) by mouth daily.   simvastatin (ZOCOR) 40 MG tablet TAKE 1 TABLET BY MOUTH EVERYDAY AT BEDTIME   No facility-administered encounter medications on file as of 05/17/2022.    Allergies (verified) Codeine   History: Past Medical History:  Diagnosis Date   Breast cancer ([place]    Cancer ([place]    breast   Colonic polyp    Hyperlipidemia    Hypertension    Personal history of radiation therapy    Past Surgical History:  Procedure Laterality Date   BREAST BIOPSY     BREAST LUMPECTOMY Right  1998   Family History  Problem Relation Age of Onset   Cancer Mother    Cancer Father    Social History   Socioeconomic History   Marital status: Married    Spouse name: Not on file   Number of children: Not on file   Years of education: Not on file   Highest education level: Not on file  Occupational History   Not on file  Tobacco Use   Smoking status: Never   Smokeless tobacco: Never  Substance and Sexual Activity   Alcohol use: No   Drug use: No   Sexual activity: Yes  Other Topics Concern   Not on file  Social  History Narrative   HPOA is her husband, Dafna Romo.     She would want CPR- no feeding tube or "heroic measures."   Social Determinants of Health   Financial Resource Strain: Low Risk  (05/17/2022)   Overall Financial Resource Strain (CARDIA)    Difficulty of Paying Living Expenses: Not hard at all  Food Insecurity: No Food Insecurity (05/17/2022)   Hunger Vital Sign    Worried About Running Out of Food in the Last Year: Never true    Ran Out of Food in the Last Year: Never true  Transportation Needs: No Transportation Needs (05/17/2022)   PRAPARE - Hydrologist (Medical): No    Lack of Transportation (Non-Medical): No  Physical Activity: Sufficiently Active (05/17/2022)   Exercise Vital Sign    Days of Exercise per Week: 5 days    Minutes of Exercise per Session: 30 min  Stress: No Stress Concern Present (05/17/2022)   [facility]    Feeling of Stress : Not at all  Social Connections: Moderately Integrated (05/17/2022)   Social Connection and Isolation Panel [NHANES]    Frequency of Communication with Friends and Family: Three times a week    Frequency of Social Gatherings with Friends and Family: Three times a week    Attends Religious Services: More than 4 times per year    Active Member of Clubs or Organizations: No    Attends Archivist Meetings: Never    Marital Status: Married    Tobacco Counseling Counseling given: Not Answered   Clinical Intake:  Pre-visit preparation completed: Yes  Pain : No/denies pain     Nutritional Risks: None Diabetes: No  How often do you need to have someone help you when you read instructions, pamphlets, or other written materials from your doctor or pharmacy?: 1 - Never What is the last grade level you completed in school?: High school  Diabetic?no   Interpreter Needed?: No  Information entered by ::  L.wilson,LPN   Activities of Daily Living    05/17/2022   10:38 AM  In your present state of health, do you have any difficulty performing the following activities:  Hearing? 0  Vision? 0  Difficulty concentrating or making decisions? 0  Walking or climbing stairs? 0  Dressing or bathing? 0  Doing errands, shopping? 0  Preparing Food and eating ? N  Using the Toilet? N  In the past six months, have you accidently leaked urine? N  Do you have problems with loss of bowel control? N  Managing your Medications? N  Managing your Finances? N  Housekeeping or managing your Housekeeping? N    Patient Care Team: Libby Maw, MD as PCP - General (Family Medicine) Kellie Moor, Dayle Points,  MD as Consulting Physician (Dermatology) Leandrew Koyanagi, MD as Referring Physician (Ophthalmology) Vernon Prey, MD as Referring Physician (Ophthalmology) Boykin Nearing, DDS as Consulting Physician (Dentistry)  Indicate any recent Medical Services you may have received from other than [facility] providers in the past year (date may be approximate).     Assessment:   This is a routine wellness examination for [place].  Hearing/Vision screen Vision Screening - Comments:: Annual eye exams   Dietary issues and exercise activities discussed: Current Exercise Habits: Home exercise routine, Type of exercise: walking, Time (Minutes): 30, Frequency (Times/Week): 5, Weekly Exercise (Minutes/Week): 150, Intensity: Mild, Exercise limited by: None identified   Goals Addressed   None    Depression Screen    05/17/2022   10:35 AM 10/18/2021   10:33 AM 07/01/2021   10:05 AM 05/11/2021    9:42 AM 12/28/2020   10:58 AM 04/29/2020    9:27 AM 04/24/2019    2:25 PM  PHQ 2/9 Scores  PHQ - 2 Score 0 0 0 0 1 0 0    Fall Risk    05/17/2022   10:36 AM 10/18/2021   10:33 AM 08/16/2021    9:31 AM 07/01/2021   10:06 AM 05/11/2021    9:44 AM  Fall Risk   Falls in the past year? 0 0 0 0 0  Number falls in past  yr: 0 0 0  0  Injury with Fall? 0  0  0  Follow up Falls evaluation completed;Education provided    Falls evaluation completed;Falls prevention discussed    FALL RISK PREVENTION PERTAINING TO THE HOME:  Any stairs in or around the home? Yes  If so, are there any without handrails? No  Home free of loose throw rugs in walkways, pet beds, electrical cords, etc? Yes  Adequate lighting in your home to reduce risk of falls? Yes   ASSISTIVE DEVICES UTILIZED TO PREVENT FALLS:  Life alert? No  Use of a cane, walker or w/c? Yes  Grab bars in the bathroom? Yes  Shower chair or bench in shower? Yes  Elevated toilet seat or a handicapped toilet? Yes     Cognitive Function:  Normal cognitive status assessed by telephone conversation  by this Nurse Health Advisor. No abnormalities found.      09/15/2016   10:23 AM  MMSE - Mini Mental State Exam  Orientation to time 5  Orientation to Place 5  Registration 3  Attention/ Calculation 0  Recall 3  Language- name 2 objects 0  Language- repeat 1  Language- follow 3 step command 3  Language- read & follow direction 0  Write a sentence 0  Copy design 0  Total score 20        Immunizations Immunization History  Administered Date(s) Administered   Fluad Quad(high Dose 65+) 06/03/2019, 06/29/2020, 07/01/2021   Influenza, High Dose Seasonal PF 07/10/2017, 07/04/2018   Influenza-Unspecified 07/11/2016   PFIZER(Purple Top)SARS-COV-2 Vaccination 11/17/2019, 12/10/2019, 07/29/2020   Pneumococcal Conjugate-13 05/26/2014   Pneumococcal Polysaccharide-23 09/15/2016   Td 02/11/2009   Zoster Recombinat (Shingrix) 12/30/2020, 04/15/2021   Zoster, Live 02/15/2010    TDAP status: Due, Education has been provided regarding the importance of this vaccine. Advised may receive this vaccine at local pharmacy or Health Dept. Aware to provide a copy of the vaccination record if obtained from local pharmacy or Health Dept. Verbalized acceptance and  understanding.  Flu Vaccine status: Up to date  Pneumococcal vaccine status: Up to date  Covid-19 vaccine status: Completed  vaccines  Qualifies for Shingles Vaccine? Yes   Zostavax completed Yes   Shingrix Completed?: Yes  Screening Tests Health Maintenance  Topic Date Due   TETANUS/TDAP  02/12/2019   INFLUENZA VACCINE  05/03/2022   Pneumonia Vaccine 86+ Years old  Completed   DEXA SCAN  Completed   Zoster Vaccines- Shingrix  Completed   HPV VACCINES  Aged Out   COVID-19 Vaccine  Discontinued    Health Maintenance  Health Maintenance Due  Topic Date Due   TETANUS/TDAP  02/12/2019   INFLUENZA VACCINE  05/03/2022    Colorectal cancer screening: No longer required.   Mammogram status: No longer required due to age.  Bone Density status: Completed 09/002/2020. Results reflect: Bone density results: OSTEOPENIA. Repeat every 5 years.  Lung Cancer Screening: (Low Dose CT Chest recommended if Age 73-80 years, 30 pack-year currently smoking OR have quit w/in 15years.) does not qualify.   Lung Cancer Screening Referral: n/a  Additional Screening:  Hepatitis C Screening: does not qualify;  Vision Screening: Recommended annual ophthalmology exams for early detection of glaucoma and other disorders of the eye. Is the patient up to date with their annual eye exam?  Yes  Who is the provider or what is the name of the office in which the patient attends annual eye exams? Dr.Brazington  If pt is not established with a provider, would they like to be referred to a provider to establish care? No .   Dental Screening: Recommended annual dental exams for proper oral hygiene  Community Resource Referral / Chronic Care Management: CRR required this visit?  No   CCM required this visit?  No      Plan:     I have personally reviewed and noted the following in the patient's chart:   Medical and social history Use of alcohol, tobacco or illicit drugs  Current medications and  supplements including opioid prescriptions.  Functional ability and status Nutritional status Physical activity Advanced directives List of other physicians Hospitalizations, surgeries, and ER visits in previous 12 months Vitals Screenings to include cognitive, depression, and falls Referrals and appointments  In addition, I have reviewed and discussed with patient certain preventive protocols, quality metrics, and best practice recommendations. A written personalized care plan for preventive services as well as general preventive health recommendations were provided to patient.     Daphane Shepherd, LPN   [id number]   Nurse Notes: none

## 2022-06-22 DIAGNOSIS — L820 Inflamed seborrheic keratosis: Secondary | ICD-10-CM | POA: Diagnosis not present

## 2022-06-22 DIAGNOSIS — D2261 Melanocytic nevi of right upper limb, including shoulder: Secondary | ICD-10-CM | POA: Diagnosis not present

## 2022-06-22 DIAGNOSIS — D2262 Melanocytic nevi of left upper limb, including shoulder: Secondary | ICD-10-CM | POA: Diagnosis not present

## 2022-06-22 DIAGNOSIS — Z85828 Personal history of other malignant neoplasm of skin: Secondary | ICD-10-CM | POA: Diagnosis not present

## 2022-06-22 DIAGNOSIS — L538 Other specified erythematous conditions: Secondary | ICD-10-CM | POA: Diagnosis not present

## 2022-06-22 DIAGNOSIS — D2272 Melanocytic nevi of left lower limb, including hip: Secondary | ICD-10-CM | POA: Diagnosis not present

## 2022-06-23 DIAGNOSIS — Z23 Encounter for immunization: Secondary | ICD-10-CM | POA: Diagnosis not present

## 2022-07-11 DIAGNOSIS — H353134 Nonexudative age-related macular degeneration, bilateral, advanced atrophic with subfoveal involvement: Secondary | ICD-10-CM | POA: Diagnosis not present

## 2022-07-26 DIAGNOSIS — H2513 Age-related nuclear cataract, bilateral: Secondary | ICD-10-CM | POA: Diagnosis not present

## 2022-08-29 ENCOUNTER — Other Ambulatory Visit: Payer: Self-pay | Admitting: Family Medicine

## 2022-08-29 DIAGNOSIS — B356 Tinea cruris: Secondary | ICD-10-CM

## 2022-09-18 ENCOUNTER — Other Ambulatory Visit: Payer: Self-pay | Admitting: Family Medicine

## 2022-09-18 DIAGNOSIS — I10 Essential (primary) hypertension: Secondary | ICD-10-CM

## 2022-10-17 DIAGNOSIS — H33303 Unspecified retinal break, bilateral: Secondary | ICD-10-CM | POA: Diagnosis not present

## 2022-10-19 ENCOUNTER — Encounter: Payer: Self-pay | Admitting: Family Medicine

## 2022-10-19 ENCOUNTER — Ambulatory Visit (INDEPENDENT_AMBULATORY_CARE_PROVIDER_SITE_OTHER): Payer: Medicare Other | Admitting: Family Medicine

## 2022-10-19 VITALS — BP 140/72 | HR 76 | Temp 97.4°F | Ht 63.0 in | Wt 143.4 lb

## 2022-10-19 DIAGNOSIS — H547 Unspecified visual loss: Secondary | ICD-10-CM

## 2022-10-19 DIAGNOSIS — I10 Essential (primary) hypertension: Secondary | ICD-10-CM | POA: Diagnosis not present

## 2022-10-19 DIAGNOSIS — E782 Mixed hyperlipidemia: Secondary | ICD-10-CM | POA: Diagnosis not present

## 2022-10-19 LAB — URINALYSIS, ROUTINE W REFLEX MICROSCOPIC
Bilirubin Urine: NEGATIVE
Hgb urine dipstick: NEGATIVE
Ketones, ur: NEGATIVE
Leukocytes,Ua: NEGATIVE
Nitrite: NEGATIVE
RBC / HPF: NONE SEEN (ref 0–?)
Specific Gravity, Urine: >=1.03 — AB (ref 1.000–1.030)
Total Protein, Urine: NEGATIVE
Urine Glucose: NEGATIVE
Urobilinogen, UA: 0.2 (ref 0.0–1.0)
pH: 6 (ref 5.0–8.0)

## 2022-10-19 LAB — CBC WITH DIFFERENTIAL/PLATELET
Basophils Absolute: 0 10*3/uL (ref 0.0–0.1)
Basophils Relative: 0.7 % (ref 0.0–3.0)
Eosinophils Absolute: 0.1 10*3/uL (ref 0.0–0.7)
Eosinophils Relative: 1.2 % (ref 0.0–5.0)
HCT: 38.9 % (ref 36.0–46.0)
Hemoglobin: 13.4 g/dL (ref 12.0–15.0)
Lymphocytes Relative: 31.6 % (ref 12.0–46.0)
Lymphs Abs: 2.1 10*3/uL (ref 0.7–4.0)
MCHC: 34.4 g/dL (ref 30.0–36.0)
MCV: 95.9 fl (ref 78.0–100.0)
Monocytes Absolute: 0.7 10*3/uL (ref 0.1–1.0)
Monocytes Relative: 10.5 % (ref 3.0–12.0)
Neutro Abs: 3.8 10*3/uL (ref 1.4–7.7)
Neutrophils Relative %: 56 % (ref 43.0–77.0)
Platelets: 229 10*3/uL (ref 150.0–400.0)
RBC: 4.05 Mil/uL (ref 3.87–5.11)
RDW: 13.2 % (ref 11.5–15.5)
WBC: 6.7 10*3/uL (ref 4.0–10.5)

## 2022-10-19 LAB — LDL CHOLESTEROL, DIRECT: Direct LDL: 80 mg/dL

## 2022-10-19 LAB — COMPREHENSIVE METABOLIC PANEL
ALT: 11 U/L (ref 0–35)
AST: 13 U/L (ref 0–37)
Albumin: 4.2 g/dL (ref 3.5–5.2)
Alkaline Phosphatase: 54 U/L (ref 39–117)
BUN: 15 mg/dL (ref 6–23)
CO2: 31 mEq/L (ref 19–32)
Calcium: 10.4 mg/dL (ref 8.4–10.5)
Chloride: 106 mEq/L (ref 96–112)
Creatinine, Ser: 0.79 mg/dL (ref 0.40–1.20)
GFR: 68.89 mL/min (ref >60.00)
Glucose, Bld: 96 mg/dL (ref 70–99)
Potassium: 4.1 mEq/L (ref 3.5–5.1)
Sodium: 142 mEq/L (ref 135–145)
Total Bilirubin: 0.6 mg/dL (ref 0.2–1.2)
Total Protein: 6.7 g/dL (ref 6.0–8.3)

## 2022-10-19 LAB — LIPID PANEL
Cholesterol: 126 mg/dL (ref 0–200)
HDL: 27.8 mg/dL — ABNORMAL LOW (ref >39.00)
NonHDL: 98.47
Total CHOL/HDL Ratio: 5
Triglycerides: 230 mg/dL — ABNORMAL HIGH (ref 0.0–149.0)
VLDL: 46 mg/dL — ABNORMAL HIGH (ref 0.0–40.0)

## 2022-10-19 NOTE — Progress Notes (Signed)
Established Patient Office Visit   Subjective:  Patient ID: Heidi Sanchez, female    DOB: 1941-07-04  Age: 82 y.o. MRN: [id number]  Chief Complaint  Patient presents with   Follow-up    6 month follow up, no concerns. Patient fasting.     HPI Encounter Diagnoses  Name Primary?   White coat syndrome with diagnosis of hypertension Yes   Mixed hyperlipidemia    Impaired vision    For follow-up of hypertension and elevated cholesterol with a low HDL.  Continue simvastatin 40 mg and omega-3 fatty acids (fish oil) for elevated cholesterol and low HDL.  Continues amlodipine 5 mg and lisinopril 20 for hypertension.  Blood pressures at home are running in the 125-135/75-85 range.   Review of Systems  Constitutional: Negative.   HENT: Negative.    Eyes:  Negative for discharge and redness.  Respiratory: Negative.    Cardiovascular: Negative.   Gastrointestinal:  Negative for abdominal pain.  Genitourinary: Negative.   Musculoskeletal: Negative.  Negative for myalgias.  Skin:  Negative for rash.  Neurological:  Negative for tingling, loss of consciousness, weakness and headaches.  Endo/Heme/Allergies:  Negative for polydipsia.     Current Outpatient Medications:    amLODipine (NORVASC) 5 MG tablet, TAKE 1 TABLET (5 MG TOTAL) BY MOUTH DAILY. PLEASE SCHEDULE VISIT WITH NEW PROVIDER, Disp: 90 tablet, Rfl: 2   Cholecalciferol (VITAMIN D) 50 MCG (2000 UT) CAPS, Take 2,000 Units by mouth daily., Disp: , Rfl:    clotrimazole (LOTRIMIN) 1 % cream, APPLY 1 APPLICATION TOPICALLY 2 (TWO) TIMES DAILY. FOR 10 DAYS., Disp: 60 g, Rfl: 0   diphenhydrAMINE (BENADRYL) 25 MG tablet, Take 25 mg by mouth as needed., Disp: , Rfl:    fluorouracil (EFUDEX) 5 % cream, Apply topically 2 (two) times daily., Disp: , Rfl:    fluticasone (FLONASE) 50 MCG/ACT nasal spray, SPRAY 2 SPRAYS INTO EACH NOSTRIL EVERY DAY, Disp: 16 mL, Rfl: 6   lisinopril (ZESTRIL) 20 MG tablet, Take 1 tablet (20 mg total) by mouth  daily., Disp: 90 tablet, Rfl: 3   Multiple Vitamins-Minerals (OCUVITE PRESERVISION) TABS, 1 tablet 2 (two) times daily. Use as directed, Disp: , Rfl:    Omega-3 Fatty Acids (FISH OIL) 1000 MG CAPS, Take 1 capsule (1,000 mg total) by mouth daily., Disp: 90 capsule, Rfl: 3   simvastatin (ZOCOR) 40 MG tablet, TAKE 1 TABLET BY MOUTH EVERYDAY AT BEDTIME, Disp: 90 tablet, Rfl: 3   Objective:     BP (!) 140/72 (BP Location: Right Arm, Patient Position: Sitting, Cuff Size: Normal)   Pulse 76   Temp (!) 97.4 F (36.3 C) (Temporal)   Ht '5\' 3"'$  (1.6 m)   Wt 143 lb 6.4 oz (65 kg)   SpO2 99%   BMI 25.40 kg/m  BP Readings from Last 3 Encounters:  10/19/22 (!) 140/72  04/18/22 (!) 148/76  10/18/21 (!) 148/74   Wt Readings from Last 3 Encounters:  10/19/22 143 lb 6.4 oz (65 kg)  04/18/22 143 lb 6.4 oz (65 kg)  10/18/21 145 lb 12.8 oz (66.1 kg)      Physical Exam Constitutional:      General: She is not in acute distress.    Appearance: Normal appearance. She is not ill-appearing, toxic-appearing or diaphoretic.  HENT:     Head: Normocephalic and atraumatic.     Right Ear: External ear normal.     Left Ear: External ear normal.     Mouth/Throat:  Mouth: Mucous membranes are moist.     Pharynx: Oropharynx is clear. No oropharyngeal exudate or posterior oropharyngeal erythema.  Eyes:     General: No scleral icterus.       Right eye: No discharge.        Left eye: No discharge.     Extraocular Movements: Extraocular movements intact.     Conjunctiva/sclera: Conjunctivae normal.     Pupils: Pupils are equal, round, and reactive to light.  Neck:     Vascular: No carotid bruit.  Cardiovascular:     Rate and Rhythm: Normal rate and regular rhythm.  Pulmonary:     Effort: Pulmonary effort is normal. No respiratory distress.     Breath sounds: Normal breath sounds.  Abdominal:     General: Bowel sounds are normal.  Musculoskeletal:     Cervical back: No rigidity or tenderness.   Lymphadenopathy:     Cervical: No cervical adenopathy.  Skin:    General: Skin is warm and dry.  Neurological:     Mental Status: She is alert and oriented to person, place, and time.  Psychiatric:        Mood and Affect: Mood normal.        Behavior: Behavior normal.      No results found for any visits on 10/19/22.    The ASCVD Risk score (Arnett DK, et al., 2019) failed to calculate for the following reasons:   The 2019 ASCVD risk score is only valid for ages 63 to 48    Assessment & Plan:   White coat syndrome with diagnosis of hypertension -     CBC with Differential/Platelet -     Comprehensive metabolic panel -     Lipid panel -     Urinalysis, Routine w reflex microscopic  Mixed hyperlipidemia -     Comprehensive metabolic panel  Impaired vision    Return in about 6 months (around 04/19/2023).  Continue current medications for hypertension and hyperlipidemia.  Information was given on managing hypertension.  Discussed reducing sodium in the diet to a more than 3 g daily.  Continue exercising daily  Libby Maw, MD

## 2022-11-07 DIAGNOSIS — H4423 Degenerative myopia, bilateral: Secondary | ICD-10-CM | POA: Diagnosis not present

## 2022-11-07 DIAGNOSIS — H2513 Age-related nuclear cataract, bilateral: Secondary | ICD-10-CM | POA: Diagnosis not present

## 2022-12-13 DIAGNOSIS — H2513 Age-related nuclear cataract, bilateral: Secondary | ICD-10-CM | POA: Diagnosis not present

## 2022-12-13 DIAGNOSIS — H4423 Degenerative myopia, bilateral: Secondary | ICD-10-CM | POA: Diagnosis not present

## 2022-12-13 DIAGNOSIS — H2511 Age-related nuclear cataract, right eye: Secondary | ICD-10-CM | POA: Diagnosis not present

## 2022-12-16 ENCOUNTER — Other Ambulatory Visit: Payer: Self-pay | Admitting: Family Medicine

## 2022-12-16 DIAGNOSIS — I10 Essential (primary) hypertension: Secondary | ICD-10-CM

## 2022-12-28 DIAGNOSIS — H2511 Age-related nuclear cataract, right eye: Secondary | ICD-10-CM | POA: Diagnosis not present

## 2022-12-28 DIAGNOSIS — H2513 Age-related nuclear cataract, bilateral: Secondary | ICD-10-CM | POA: Diagnosis not present

## 2022-12-28 DIAGNOSIS — H4423 Degenerative myopia, bilateral: Secondary | ICD-10-CM | POA: Diagnosis not present

## 2022-12-29 ENCOUNTER — Encounter: Payer: Self-pay | Admitting: Ophthalmology

## 2023-01-09 NOTE — Discharge Instructions (Signed)

## 2023-01-11 ENCOUNTER — Ambulatory Visit: Payer: Medicare Other | Admitting: Anesthesiology

## 2023-01-11 ENCOUNTER — Encounter: Payer: Self-pay | Admitting: Ophthalmology

## 2023-01-11 ENCOUNTER — Encounter
Admission: RE | Disposition: A | Discharge status: Home / Self Care | Payer: Self-pay | Source: Ambulatory Visit | Attending: Ophthalmology

## 2023-01-11 ENCOUNTER — Ambulatory Visit
Admission: RE | Admit: 2023-01-11 | Discharge: 2023-01-11 | Disposition: A | Discharge status: Home / Self Care | Payer: Medicare Other | Source: Ambulatory Visit | Attending: Ophthalmology | Admitting: Ophthalmology

## 2023-01-11 ENCOUNTER — Other Ambulatory Visit: Payer: Self-pay

## 2023-01-11 DIAGNOSIS — H2511 Age-related nuclear cataract, right eye: Secondary | ICD-10-CM | POA: Diagnosis not present

## 2023-01-11 DIAGNOSIS — I10 Essential (primary) hypertension: Secondary | ICD-10-CM | POA: Diagnosis not present

## 2023-01-11 HISTORY — PX: CATARACT EXTRACTION W/PHACO: SHX586

## 2023-01-11 SURGERY — PHACOEMULSIFICATION, CATARACT, WITH IOL INSERTION
Anesthesia: Monitor Anesthesia Care | Site: Eye | Laterality: Right

## 2023-01-11 MED ORDER — ARMC OPHTHALMIC DILATING DROPS
1.0000 | OPHTHALMIC | Status: DC | PRN
  Administered 2023-01-11 (×3): 1 via OPHTHALMIC

## 2023-01-11 MED ORDER — SIGHTPATH DOSE#1 BSS IO SOLN
INTRAOCULAR | Status: DC | PRN
  Administered 2023-01-11: 15 mL

## 2023-01-11 MED ORDER — TETRACAINE HCL 0.5 % OP SOLN
1.0000 [drp] | OPHTHALMIC | Status: DC | PRN
  Administered 2023-01-11 (×3): 1 [drp] via OPHTHALMIC

## 2023-01-11 MED ORDER — MIDAZOLAM HCL 2 MG/2ML IJ SOLN
INTRAMUSCULAR | Status: DC | PRN
  Administered 2023-01-11: 1 mg via INTRAVENOUS

## 2023-01-11 MED ORDER — BRIMONIDINE TARTRATE-TIMOLOL 0.2-0.5 % OP SOLN
OPHTHALMIC | Status: DC | PRN
  Administered 2023-01-11: 1 [drp] via OPHTHALMIC

## 2023-01-11 MED ORDER — SIGHTPATH DOSE#1 BSS IO SOLN
INTRAOCULAR | Status: DC | PRN
  Administered 2023-01-11: 1 mL

## 2023-01-11 MED ORDER — SIGHTPATH DOSE#1 BSS IO SOLN
INTRAOCULAR | Status: DC | PRN
  Administered 2023-01-11: 68 mL via OPHTHALMIC

## 2023-01-11 MED ORDER — SIGHTPATH DOSE#1 NA HYALUR & NA CHOND-NA HYALUR IO KIT
PACK | INTRAOCULAR | Status: DC | PRN
  Administered 2023-01-11: 1 via OPHTHALMIC

## 2023-01-11 MED ORDER — CEFUROXIME OPHTHALMIC INJECTION 1 MG/0.1 ML
INJECTION | OPHTHALMIC | Status: DC | PRN
  Administered 2023-01-11: .1 mL via INTRACAMERAL

## 2023-01-11 MED ORDER — LACTATED RINGERS IV SOLN: INTRAVENOUS | Status: DC

## 2023-01-11 MED ORDER — FENTANYL CITRATE (PF) 100 MCG/2ML IJ SOLN
INTRAMUSCULAR | Status: DC | PRN
  Administered 2023-01-11: 50 ug via INTRAVENOUS

## 2023-01-11 SURGICAL SUPPLY — 20 items
CANNULA ANT/CHMB 27G (MISCELLANEOUS) IMPLANT
CANNULA ANT/CHMB 27GA (MISCELLANEOUS) IMPLANT
CATARACT SUITE SIGHTPATH (MISCELLANEOUS) ×1 IMPLANT
FEE CATARACT SUITE SIGHTPATH (MISCELLANEOUS) ×1 IMPLANT
GLOVE SRG 8 PF TXTR STRL LF DI (GLOVE) ×1 IMPLANT
GLOVE SURG ENC TEXT LTX SZ7.5 (GLOVE) ×1 IMPLANT
GLOVE SURG GAMMEX PI TX LF 7.5 (GLOVE) IMPLANT
GLOVE SURG UNDER POLY LF SZ8 (GLOVE) ×1
LENS IOL DIOP 20.0 (Intraocular Lens) ×1 IMPLANT
LENS IOL TECNIS MONO 20.0 (Intraocular Lens) IMPLANT
NDL FILTER BLUNT 18X1 1/2 (NEEDLE) ×1 IMPLANT
NDL RETROBULBAR .5 NSTRL (NEEDLE) IMPLANT
NEEDLE FILTER BLUNT 18X1 1/2 (NEEDLE) ×1 IMPLANT
PACK VIT ANT 23G (MISCELLANEOUS) IMPLANT
RING MALYGIN 7.0 (MISCELLANEOUS) IMPLANT
SUT ETHILON 10-0 CS-B-6CS-B-6 (SUTURE)
SUT VICRYL  9 0 (SUTURE)
SUT VICRYL 9 0 (SUTURE) IMPLANT
SUTURE EHLN 10-0 CS-B-6CS-B-6 (SUTURE) IMPLANT
SYR 3ML LL SCALE MARK (SYRINGE) ×1 IMPLANT

## 2023-01-11 NOTE — Op Note (Signed)
LOCATION:  [facility]   PREOPERATIVE DIAGNOSIS:    Nuclear sclerotic cataract right eye. H25.11   POSTOPERATIVE DIAGNOSIS:  Nuclear sclerotic cataract right eye.     PROCEDURE:  Phacoemusification with posterior chamber intraocular lens placement of the right eye   ULTRASOUND TIME: Procedure(s) with comments: CATARACT EXTRACTION PHACO AND INTRAOCULAR LENS PLACEMENT (IOC) RIGHT (Right) - 4.40 0:39.1  LENS:   Implant Name Type Inv. Item Serial No. Manufacturer Lot No. LRB No. Used Action  LENS IOL DIOP 20.0 - [id number] Intraocular Lens LENS IOL DIOP 20.0 [id number] [employer]  Right 1 Implanted         SURGEON:  Deirdre Evener, MD   ANESTHESIA:  Topical with tetracaine drops and 2% Xylocaine jelly, augmented with 1% preservative-free intracameral lidocaine.    COMPLICATIONS:  None.   DESCRIPTION OF PROCEDURE:  The patient was identified in the holding room and transported to the operating room and placed in the supine position under the operating microscope.  The right eye was identified as the operative eye and it was prepped and draped in the usual sterile ophthalmic fashion.   A 1 millimeter clear-corneal paracentesis was made at the 12:00 position.  0.5 ml of preservative-free 1% lidocaine was injected into the anterior chamber. The anterior chamber was filled with Viscoat viscoelastic.  A 2.4 millimeter keratome was used to make a near-clear corneal incision at the 9:00 position.  A curvilinear capsulorrhexis was made with a cystotome and capsulorrhexis forceps.  Balanced salt solution was used to hydrodissect and hydrodelineate the nucleus.   Phacoemulsification was then used in stop and chop fashion to remove the lens nucleus and epinucleus.  The remaining cortex was then removed using the irrigation and aspiration handpiece. Provisc was then placed into the capsular bag to distend it for lens placement.  A lens was then injected into the capsular bag.  The  remaining viscoelastic was aspirated.   Wounds were hydrated with balanced salt solution.  The anterior chamber was inflated to a physiologic pressure with balanced salt solution.  No wound leaks were noted. Cefuroxime 0.1 ml of a 10mg /ml solution was injected into the anterior chamber for a dose of 1 mg of intracameral antibiotic at the completion of the case.   Timolol and Brimonidine drops were applied to the eye.  The patient was taken to the recovery room in stable condition without complications of anesthesia or surgery.   Heidi Sanchez 01/11/2023, 9:44 AM

## 2023-01-11 NOTE — Anesthesia Preprocedure Evaluation (Signed)
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 Hx headaches due to seasonal allergies negative neurological ROS  negative psych ROS   GI/Hepatic negative GI ROS, Neg liver ROS,,,  Endo/Other  negative endocrine ROS    Renal/GU negative Renal ROS  negative genitourinary   Musculoskeletal negative musculoskeletal ROS (+)    Abdominal   Peds negative pediatric ROS (+)  Hematology negative hematology ROS (+)   Anesthesia Other Findings   Reproductive/Obstetrics negative OB ROS                             Anesthesia Physical Anesthesia Plan  ASA: 3  Anesthesia Plan: MAC   Post-op Pain Management:    Induction: Intravenous  PONV Risk Score and Plan:   Airway Management Planned: Natural Airway and Nasal Cannula  Additional Equipment:   Intra-op Plan:   Post-operative Plan:   Informed Consent: I have reviewed the patients History and Physical, chart, labs and discussed the procedure including the risks, benefits and alternatives for the proposed anesthesia with the patient or authorized representative who has indicated his/her understanding and acceptance.     Dental Advisory Given  Plan Discussed with: Anesthesiologist, CRNA and Surgeon  Anesthesia Plan Comments: (Patient consented for risks of anesthesia including but  not limited to:  - adverse reactions to medications - damage to eyes, teeth, lips or other oral mucosa - nerve damage due to positioning  - sore throat or hoarseness - Damage to heart, brain, nerves, lungs, other parts of body or loss of life  Patient voiced understanding.)       Anesthesia Quick Evaluation

## 2023-01-11 NOTE — Transfer of Care (Signed)
Immediate Anesthesia Transfer of Care Note  Patient: Heidi Sanchez  Procedure(s) Performed: CATARACT EXTRACTION PHACO AND INTRAOCULAR LENS PLACEMENT (IOC) RIGHT (Right: Eye)  Patient Location: PACU  Anesthesia Type: MAC  Level of Consciousness: awake, alert  and patient cooperative  Airway and Oxygen Therapy: Patient Spontanous Breathing and Patient connected to supplemental oxygen  Post-op Assessment: Post-op Vital signs reviewed, Patient's Cardiovascular Status Stable, Respiratory Function Stable, Patent Airway and No signs of Nausea or vomiting  Post-op Vital Signs: Reviewed and stable  Complications: No notable events documented.

## 2023-01-11 NOTE — H&P (Signed)
[facility]   Primary Care Physician:  Mliss Sax, MD Ophthalmologist: Dr. Lockie Mola  Pre-Procedure History & Physical: HPI:  Heidi Sanchez is a 82 y.o. female here for ophthalmic surgery.   Past Medical History:  Diagnosis Date   Breast cancer    Cancer    breast   Colonic polyp    Hyperlipidemia    Hypertension    Personal history of radiation therapy     Past Surgical History:  Procedure Laterality Date   BREAST BIOPSY     BREAST LUMPECTOMY Right    1998    Prior to Admission medications   Medication Sig Start Date End Date Taking? Authorizing Provider  amLODipine (NORVASC) 5 MG tablet TAKE 1 TABLET (5 MG TOTAL) BY MOUTH DAILY. PLEASE SCHEDULE VISIT WITH NEW PROVIDER 09/19/22  Yes Mliss Sax, MD  Cholecalciferol (VITAMIN D) 50 MCG (2000 UT) CAPS Take 2,000 Units by mouth daily.   Yes [provider]  diphenhydrAMINE (BENADRYL) 25 MG tablet Take 25 mg by mouth as needed.   Yes [provider]  fluticasone (FLONASE) 50 MCG/ACT nasal spray SPRAY 2 SPRAYS INTO EACH NOSTRIL EVERY DAY 01/02/22  Yes Mliss Sax, MD  lisinopril (ZESTRIL) 20 MG tablet Take 1 tablet (20 mg total) by mouth daily. 01/17/22  Yes Mliss Sax, MD  Multiple Vitamins-Minerals (OCUVITE PRESERVISION) TABS 1 tablet 2 (two) times daily. Use as directed   Yes [provider]  Omega-3 Fatty Acids (FISH OIL) 1000 MG CAPS Take 1 capsule (1,000 mg total) by mouth daily. 04/26/11  Yes Dianne Dun, MD  simvastatin (ZOCOR) 40 MG tablet TAKE 1 TABLET BY MOUTH EVERYDAY AT BEDTIME 12/16/22  Yes Mliss Sax, MD  clotrimazole (LOTRIMIN) 1 % cream APPLY 1 APPLICATION TOPICALLY 2 (TWO) TIMES DAILY. FOR 10 DAYS. Patient not taking: Reported on 12/29/2022 08/29/22   Mliss Sax, MD  fluorouracil (EFUDEX) 5 % cream Apply topically 2 (two) times daily. Patient not taking: Reported on 12/29/2022    [provider]     Allergies as of 12/19/2022 - Review Complete 10/19/2022  Allergen Reaction Noted   Codeine  02/15/2010    Family History  Problem Relation Age of Onset   Cancer Mother    Cancer Father     Social History   Socioeconomic History   Marital status: Married    Spouse name: Not on file   Number of children: Not on file   Years of education: Not on file   Highest education level: Not on file  Occupational History   Not on file  Tobacco Use   Smoking status: Never   Smokeless tobacco: Never  Substance and Sexual Activity   Alcohol use: No   Drug use: No   Sexual activity: Yes  Other Topics Concern   Not on file  Social History Narrative   HPOA is her husband, Izzah Asante.     She would want CPR- no feeding tube or "heroic measures."   Social Determinants of Health   Financial Resource Strain: Low Risk  (05/17/2022)   Overall Financial Resource Strain (CARDIA)    Difficulty of Paying Living Expenses: Not hard at all  Food Insecurity: No Food Insecurity (05/17/2022)   Hunger Vital Sign    Worried About Running Out of Food in the Last Year: Never true    Ran Out of Food in the Last Year: Never true  Transportation Needs: No Transportation Needs (05/17/2022)  PRAPARE - Administrator, Civil Service (Medical): No    Lack of Transportation (Non-Medical): No  Physical Activity: Sufficiently Active (05/17/2022)   Exercise Vital Sign    Days of Exercise per Week: 5 days    Minutes of Exercise per Session: 30 min  Stress: No Stress Concern Present (05/17/2022)   [employer] of Occupational Health - Occupational Stress Questionnaire    Feeling of Stress : Not at all  Social Connections: Moderately Integrated (05/17/2022)   Social Connection and Isolation Panel [NHANES]    Frequency of Communication with Friends and Family: Three times a week    Frequency of Social Gatherings with Friends and Family: Three times a week    Attends Religious Services: More  than 4 times per year    Active Member of Clubs or Organizations: No    Attends Banker Meetings: Never    Marital Status: Married  Catering manager Violence: Not At Risk (05/17/2022)   Humiliation, Afraid, Rape, and Kick questionnaire    Fear of Current or Ex-Partner: No    Emotionally Abused: No    Physically Abused: No    Sexually Abused: No    Review of Systems: See HPI, otherwise negative ROS  Physical Exam: Ht 5\' 3"  (1.6 m)   Wt 65.8 kg   BMI 25.69 kg/m  General:   Alert,  pleasant and cooperative in NAD Head:  Normocephalic and atraumatic. Lungs:  Clear to auscultation.    Heart:  Regular rate and rhythm.   Impression/Plan: Heidi Sanchez is here for ophthalmic surgery.  Risks, benefits, limitations, and alternatives regarding ophthalmic surgery have been reviewed with the patient.  Questions have been answered.  All parties agreeable.   Lockie Mola, MD  01/11/2023, 8:31 AM

## 2023-01-11 NOTE — Anesthesia Postprocedure Evaluation (Signed)
Anesthesia Post Note  Patient: Heidi Sanchez  Procedure(s) Performed: CATARACT EXTRACTION PHACO AND INTRAOCULAR LENS PLACEMENT (IOC) RIGHT (Right: Eye)  Patient location during evaluation: PACU Anesthesia Type: MAC Level of consciousness: awake and alert Pain management: pain level controlled Vital Signs Assessment: post-procedure vital signs reviewed and stable Respiratory status: spontaneous breathing, nonlabored ventilation, respiratory function stable and patient connected to nasal cannula oxygen Cardiovascular status: stable and blood pressure returned to baseline Postop Assessment: no apparent nausea or vomiting Anesthetic complications: no   No notable events documented.   Last Vitals:  Vitals:   01/11/23 0946 01/11/23 0952  BP: 136/74 (!) 141/86  Pulse: 70 72  Resp: 15 14  Temp: (!) 36.4 C (!) 36.4 C  SpO2: 98% 97%    Last Pain:  Vitals:   01/11/23 0952  PainSc: 0-No pain                 Kristyn Obyrne C Deara Bober

## 2023-01-12 ENCOUNTER — Encounter: Payer: Self-pay | Admitting: Ophthalmology

## 2023-01-15 ENCOUNTER — Other Ambulatory Visit: Payer: Self-pay | Admitting: Family Medicine

## 2023-01-15 DIAGNOSIS — I10 Essential (primary) hypertension: Secondary | ICD-10-CM

## 2023-01-30 ENCOUNTER — Other Ambulatory Visit: Payer: Self-pay | Admitting: Family Medicine

## 2023-02-07 DIAGNOSIS — Z961 Presence of intraocular lens: Secondary | ICD-10-CM | POA: Diagnosis not present

## 2023-04-19 ENCOUNTER — Encounter: Payer: Self-pay | Admitting: Family Medicine

## 2023-04-19 ENCOUNTER — Ambulatory Visit: Payer: Medicare Other | Admitting: Family Medicine

## 2023-04-19 VITALS — BP 136/82 | HR 69 | Temp 98.3°F | Ht 62.0 in | Wt 140.0 lb

## 2023-04-19 DIAGNOSIS — I10 Essential (primary) hypertension: Secondary | ICD-10-CM | POA: Diagnosis not present

## 2023-04-19 DIAGNOSIS — E782 Mixed hyperlipidemia: Secondary | ICD-10-CM

## 2023-04-19 LAB — LIPID PANEL
Cholesterol: 138 mg/dL (ref 0–200)
HDL: 31 mg/dL — ABNORMAL LOW (ref >39.00)
LDL Cholesterol: 80 mg/dL (ref 0–99)
NonHDL: 106.75
Total CHOL/HDL Ratio: 4
Triglycerides: 133 mg/dL (ref 0.0–149.0)
VLDL: 26.6 mg/dL (ref 0.0–40.0)

## 2023-04-19 LAB — BASIC METABOLIC PANEL
BUN: 13 mg/dL (ref 6–23)
CO2: 25 mEq/L (ref 19–32)
Calcium: 10.3 mg/dL (ref 8.4–10.5)
Chloride: 105 mEq/L (ref 96–112)
Creatinine, Ser: 0.82 mg/dL (ref 0.40–1.20)
GFR: 65.65 mL/min (ref >60.00)
Glucose, Bld: 100 mg/dL — ABNORMAL HIGH (ref 70–99)
Potassium: 4.2 mEq/L (ref 3.5–5.1)
Sodium: 140 mEq/L (ref 135–145)

## 2023-04-19 MED ORDER — AMLODIPINE BESYLATE 5 MG PO TABS
ORAL_TABLET | ORAL | 2 refills | Status: DC
Start: 2023-04-19 — End: 2024-05-20

## 2023-04-19 MED ORDER — AMLODIPINE BESYLATE 5 MG PO TABS
ORAL_TABLET | ORAL | 2 refills | Status: DC
Start: 2023-04-19 — End: 2023-04-19

## 2023-04-19 NOTE — Progress Notes (Addendum)
While patient was here for lab visit there wasn't enough blood in the CBC w/ diff lavender tube. The lab was unable to run the test. Pt was called and advised to come back for a lab visit fasting. Pt scheduled 04/20/2023 for lab visit at 9 am. Lab placed back in future.

## 2023-04-19 NOTE — Progress Notes (Signed)
Thank you  Established Patient Office Visit   Subjective:  Patient ID: Heidi Sanchez, female    DOB: 1941-03-06  Age: 82 y.o. MRN: [id number]  Chief Complaint  Patient presents with   Medical Management of Chronic Issues    6 month follow up. Pt. Fasting. Pt states she is doing well. No concern.     HPI Encounter Diagnoses  Name Primary?   White coat syndrome with diagnosis of hypertension Yes   Mixed hyperlipidemia    For follow-up of above.  Blood pressure well-controlled with lisinopril 20 mg and amlodipine 5 mg.  There is been no cough or swelling in her lower extremities.  Continue simvastatin 40 for control of cholesterol.  She is exercising by walking every day early morning with her husband.  She is visually impaired after recent cataract surgery she is able to discern colors well.  She is fasting this morning.  Will be following up on elevated triglycerides seen with this past lipid profile.   Review of Systems  Constitutional: Negative.   HENT: Negative.    Eyes:  Positive for blurred vision. Negative for discharge and redness.  Respiratory: Negative.    Cardiovascular: Negative.   Gastrointestinal:  Negative for abdominal pain.  Genitourinary: Negative.   Musculoskeletal: Negative.  Negative for myalgias.  Skin:  Negative for rash.  Neurological:  Negative for tingling, loss of consciousness and weakness.  Endo/Heme/Allergies:  Negative for polydipsia.      04/19/2023   10:13 AM 10/19/2022   10:08 AM 05/17/2022   10:35 AM  Depression screen PHQ 2/9  Decreased Interest 0 0 0  Down, Depressed, Hopeless 0 0 0  PHQ - 2 Score 0 0 0      Current Outpatient Medications:    Cholecalciferol (VITAMIN D) 50 MCG (2000 UT) CAPS, Take 2,000 Units by mouth daily., Disp: , Rfl:    clotrimazole (LOTRIMIN) 1 % cream, APPLY 1 APPLICATION TOPICALLY 2 (TWO) TIMES DAILY. FOR 10 DAYS., Disp: 60 g, Rfl: 0   diphenhydrAMINE (BENADRYL) 25 MG tablet, Take 25 mg by mouth as needed.,  Disp: , Rfl:    fluorouracil (EFUDEX) 5 % cream, Apply topically 2 (two) times daily., Disp: , Rfl:    fluticasone (FLONASE) 50 MCG/ACT nasal spray, SPRAY 2 SPRAYS INTO EACH NOSTRIL EVERY DAY, Disp: 48 mL, Rfl: 2   lisinopril (ZESTRIL) 20 MG tablet, TAKE 1 TABLET BY MOUTH EVERY DAY, Disp: 90 tablet, Rfl: 3   Multiple Vitamins-Minerals (OCUVITE PRESERVISION) TABS, 1 tablet 2 (two) times daily. Use as directed, Disp: , Rfl:    Omega-3 Fatty Acids (FISH OIL) 1000 MG CAPS, Take 1 capsule (1,000 mg total) by mouth daily., Disp: 90 capsule, Rfl: 3   simvastatin (ZOCOR) 40 MG tablet, TAKE 1 TABLET BY MOUTH EVERYDAY AT BEDTIME, Disp: 90 tablet, Rfl: 3   amLODipine (NORVASC) 5 MG tablet, TAKE 1 TABLET (5 MG TOTAL) BY MOUTH DAILY., Disp: 90 tablet, Rfl: 2   Objective:     BP 136/82   Pulse 69   Temp 98.3 F (36.8 C)   Ht 5\' 2"  (1.575 m)   Wt 140 lb (63.5 kg)   SpO2 97%   BMI 25.61 kg/m    Physical Exam Constitutional:      General: She is not in acute distress.    Appearance: Normal appearance. She is not ill-appearing, toxic-appearing or diaphoretic.  HENT:     Head: Normocephalic and atraumatic.     Right Ear: External ear normal.  Left Ear: External ear normal.  Eyes:     General: No scleral icterus.       Right eye: No discharge.        Left eye: No discharge.     Extraocular Movements: Extraocular movements intact.     Conjunctiva/sclera: Conjunctivae normal.  Pulmonary:     Effort: Pulmonary effort is normal. No respiratory distress.  Skin:    General: Skin is warm and dry.  Neurological:     Mental Status: She is alert and oriented to person, place, and time.  Psychiatric:        Mood and Affect: Mood normal.        Behavior: Behavior normal.      No results found for any visits on 04/19/23.    The ASCVD Risk score (Arnett DK, et al., 2019) failed to calculate for the following reasons:   The 2019 ASCVD risk score is only valid for ages 6 to 39    Assessment  & Plan:   White coat syndrome with diagnosis of hypertension -     CBC with Differential/Platelet -     Basic metabolic panel -     amLODIPine Besylate; TAKE 1 TABLET (5 MG TOTAL) BY MOUTH DAILY.  Dispense: 90 tablet; Refill: 2  Mixed hyperlipidemia -     Lipid panel    Return in about 6 months (around 10/20/2023).  Continue medications as above.  Continue healthy lifestyle.  Information was given on managing hypertension.  Mliss Sax, MD

## 2023-04-19 NOTE — Addendum Note (Signed)
Addended by: Trudee Kuster on: 04/19/2023 03:49 PM   Modules accepted: Orders

## 2023-04-20 ENCOUNTER — Other Ambulatory Visit (INDEPENDENT_AMBULATORY_CARE_PROVIDER_SITE_OTHER): Payer: Medicare Other

## 2023-04-20 DIAGNOSIS — I10 Essential (primary) hypertension: Secondary | ICD-10-CM | POA: Diagnosis not present

## 2023-04-20 LAB — CBC WITH DIFFERENTIAL/PLATELET
Basophils Absolute: 0.1 10*3/uL (ref 0.0–0.1)
Basophils Relative: 0.9 % (ref 0.0–3.0)
Eosinophils Absolute: 0.1 10*3/uL (ref 0.0–0.7)
Eosinophils Relative: 1.8 % (ref 0.0–5.0)
HCT: 40.1 % (ref 36.0–46.0)
Hemoglobin: 13.3 g/dL (ref 12.0–15.0)
Lymphocytes Relative: 31.1 % (ref 12.0–46.0)
Lymphs Abs: 2 10*3/uL (ref 0.7–4.0)
MCHC: 33.1 g/dL (ref 30.0–36.0)
MCV: 96.2 fl (ref 78.0–100.0)
Monocytes Absolute: 0.6 10*3/uL (ref 0.1–1.0)
Monocytes Relative: 9.6 % (ref 3.0–12.0)
Neutro Abs: 3.7 10*3/uL (ref 1.4–7.7)
Neutrophils Relative %: 56.6 % (ref 43.0–77.0)
Platelets: 252 10*3/uL (ref 150.0–400.0)
RBC: 4.17 Mil/uL (ref 3.87–5.11)
RDW: 12.5 % (ref 11.5–15.5)
WBC: 6.5 10*3/uL (ref 4.0–10.5)

## 2023-05-03 ENCOUNTER — Encounter (INDEPENDENT_AMBULATORY_CARE_PROVIDER_SITE_OTHER): Payer: Self-pay

## 2023-05-19 ENCOUNTER — Ambulatory Visit (INDEPENDENT_AMBULATORY_CARE_PROVIDER_SITE_OTHER): Payer: Medicare Other

## 2023-05-19 DIAGNOSIS — Z0000 Encounter for general adult medical examination without abnormal findings: Secondary | ICD-10-CM

## 2023-05-19 NOTE — Progress Notes (Signed)
Subjective:   Heidi Sanchez is a 82 y.o. female who presents for Medicare Annual (Subsequent) preventive examination.  Visit Complete: Virtual  I connected with  Heidi Sanchez on 05/19/23 by a audio enabled telemedicine application and verified that I am speaking with the correct person using two identifiers.  Patient Location: Home  Provider Location: Office/Clinic  I discussed the limitations of evaluation and management by telemedicine. The patient expressed understanding and agreed to proceed.  Vital Signs: Unable to obtain new vitals due to this being a telehealth visit.  Review of Systems     Cardiac Risk Factors include: advanced age (>46men, >37 women);dyslipidemia;hypertension     Objective:    Today's Vitals   There is no height or weight on file to calculate BMI.     05/19/2023    1:28 PM 01/11/2023    8:19 AM 05/17/2022   10:36 AM 05/11/2021    9:43 AM 04/29/2020    9:24 AM 04/24/2019    2:24 PM 09/15/2016    9:25 AM  Advanced Directives  Does Patient Have a Medical Advance Directive? No No Yes No Yes Yes Yes  Type of Surveyor, minerals;Living will  Healthcare Power of [place];Living will Healthcare Power of [place];Living will Healthcare Power of [place];Living will  Does patient want to make changes to medical advance directive?      No - Patient declined   Copy of Healthcare Power of Attorney in Chart?   No - copy requested  No - copy requested No - copy requested No - copy requested  Would patient like information on creating a medical advance directive?  No - Patient declined  No - Patient declined       Current Medications (verified) Outpatient Encounter Medications as of 05/19/2023  Medication Sig   amLODipine (NORVASC) 5 MG tablet TAKE 1 TABLET (5 MG TOTAL) BY MOUTH DAILY.   Cholecalciferol (VITAMIN D) 50 MCG (2000 UT) CAPS Take 2,000 Units by mouth daily.   diphenhydrAMINE (BENADRYL) 25 MG tablet Take 25 mg by mouth as  needed.   fluticasone (FLONASE) 50 MCG/ACT nasal spray SPRAY 2 SPRAYS INTO EACH NOSTRIL EVERY DAY   lisinopril (ZESTRIL) 20 MG tablet TAKE 1 TABLET BY MOUTH EVERY DAY   Multiple Vitamins-Minerals (OCUVITE PRESERVISION) TABS 1 tablet 2 (two) times daily. Use as directed   Omega-3 Fatty Acids (FISH OIL) 1000 MG CAPS Take 1 capsule (1,000 mg total) by mouth daily.   simvastatin (ZOCOR) 40 MG tablet TAKE 1 TABLET BY MOUTH EVERYDAY AT BEDTIME   clotrimazole (LOTRIMIN) 1 % cream APPLY 1 APPLICATION TOPICALLY 2 (TWO) TIMES DAILY. FOR 10 DAYS. (Patient not taking: Reported on 05/19/2023)   fluorouracil (EFUDEX) 5 % cream Apply topically 2 (two) times daily. (Patient not taking: Reported on 05/19/2023)   No facility-administered encounter medications on file as of 05/19/2023.    Allergies (verified) Codeine   History: Past Medical History:  Diagnosis Date   Breast cancer (HCC)    Cancer (HCC)    breast   Colonic polyp    Hyperlipidemia    Hypertension    Personal history of radiation therapy    Past Surgical History:  Procedure Laterality Date   BREAST BIOPSY     BREAST LUMPECTOMY Right    1998   CATARACT EXTRACTION W/PHACO Right 01/11/2023   Procedure: CATARACT EXTRACTION PHACO AND INTRAOCULAR LENS PLACEMENT (IOC) RIGHT;  Surgeon: Lockie Mola, MD;  Location: [facility];  Service:  Ophthalmology;  Laterality: Right;  4.40 0:39.1   Family History  Problem Relation Age of Onset   Cancer Mother    Cancer Father    Social History   Socioeconomic History   Marital status: Married    Spouse name: Not on file   Number of children: Not on file   Years of education: Not on file   Highest education level: Not on file  Occupational History   Not on file  Tobacco Use   Smoking status: Never   Smokeless tobacco: Never  Substance and Sexual Activity   Alcohol use: No   Drug use: No   Sexual activity: Yes  Other Topics Concern   Not on file  Social History Narrative    HPOA is her husband, Heidi Sanchez.     She would want CPR- no feeding tube or "heroic measures."   Social Determinants of Health   Financial Resource Strain: Low Risk  (05/19/2023)   Overall Financial Resource Strain (CARDIA)    Difficulty of Paying Living Expenses: Not hard at all  Food Insecurity: No Food Insecurity (05/19/2023)   Hunger Vital Sign    Worried About Running Out of Food in the Last Year: Never true    Ran Out of Food in the Last Year: Never true  Transportation Needs: No Transportation Needs (05/19/2023)   PRAPARE - Administrator, Civil Service (Medical): No    Lack of Transportation (Non-Medical): No  Physical Activity: Sufficiently Active (05/19/2023)   Exercise Vital Sign    Days of Exercise per Week: 5 days    Minutes of Exercise per Session: 30 min  Stress: No Stress Concern Present (05/19/2023)   [employer] of Occupational Health - Occupational Stress Questionnaire    Feeling of Stress : Not at all  Social Connections: Socially Integrated (05/19/2023)   Social Connection and Isolation Panel [NHANES]    Frequency of Communication with Friends and Family: Three times a week    Frequency of Social Gatherings with Friends and Family: Once a week    Attends Religious Services: More than 4 times per year    Active Member of [employer] or Organizations: Yes    Attends Engineer, structural: More than 4 times per year    Marital Status: Married    Tobacco Counseling Counseling given: Not Answered   Clinical Intake:  Pre-visit preparation completed: Yes  Pain : No/denies pain     Nutritional Risks: None Diabetes: No  How often do you need to have someone help you when you read instructions, pamphlets, or other written materials from your doctor or pharmacy?: 4 - Often  Interpreter Needed?: No  Information entered by :: NAllen LPN   Activities of Daily Living    05/19/2023    1:23 PM 01/11/2023    8:18 AM  In your present state  of health, do you have any difficulty performing the following activities:  Hearing? 0 0  Vision? 1 0  Comment legally blind   Difficulty concentrating or making decisions? 0 0  Walking or climbing stairs? 0 0  Dressing or bathing? 0 0  Doing errands, shopping? 1   Comment does not drive   Preparing Food and eating ? N   Using the Toilet? N   In the past six months, have you accidently leaked urine? N   Do you have problems with loss of bowel control? N   Managing your Medications? N   Managing your Finances? N  Housekeeping or managing your Housekeeping? N     Patient Care Team: Mliss Sax, MD as PCP - General (Family Medicine) Debbrah Alar, MD as Consulting Physician (Dermatology) Lockie Mola, MD as Referring Physician (Ophthalmology) Murray Hodgkins, MD as Referring Physician (Ophthalmology) Dario Ave, DDS as Consulting Physician (Dentistry)  Indicate any recent Medical Services you may have received from other than [facility] providers in the past year (date may be approximate).     Assessment:   This is a routine wellness examination for [place].  Hearing/Vision screen Hearing Screening - Comments:: Denies hearing issues Vision Screening - Comments:: Regular eye exams, [facility]  Dietary issues and exercise activities discussed:     Goals Addressed             This Visit's Progress    Patient Stated       05/19/2023, continue healthy lifestyle       Depression Screen    05/19/2023    1:29 PM 04/19/2023   10:13 AM 10/19/2022   10:08 AM 05/17/2022   10:35 AM 10/18/2021   10:33 AM 07/01/2021   10:05 AM 05/11/2021    9:42 AM  PHQ 2/9 Scores  PHQ - 2 Score 0 0 0 0 0 0 0  PHQ- 9 Score 0          Fall Risk    05/19/2023    1:29 PM 04/19/2023   10:08 AM 10/19/2022   10:08 AM 05/17/2022   10:36 AM 10/18/2021   10:33 AM  Fall Risk   Falls in the past year? 0 0 0 0 0  Number falls in past yr: 0 0 0 0 0  Injury with Fall? 0 0 0  0   Risk for fall due to : Medication side effect No Fall Risks No Fall Risks    Follow up Falls prevention discussed;Falls evaluation completed Falls evaluation completed Falls evaluation completed Falls evaluation completed;Education provided     MEDICARE RISK AT HOME:  Medicare Risk at Home - 05/19/23 1329     Any stairs in or around the home? Yes    If so, are there any without handrails? No    Home free of loose throw rugs in walkways, pet beds, electrical cords, etc? Yes    Adequate lighting in your home to reduce risk of falls? Yes    Life alert? No    Use of a cane, walker or w/c? No    Grab bars in the bathroom? Yes    Shower chair or bench in shower? Yes    Elevated toilet seat or a handicapped toilet? Yes             TIMED UP AND GO:  Was the test performed?  No    Cognitive Function:    09/15/2016   10:23 AM  MMSE - Mini Mental State Exam  Orientation to time 5  Orientation to Place 5  Registration 3  Attention/ Calculation 0  Recall 3  Language- name 2 objects 0  Language- repeat 1  Language- follow 3 step command 3  Language- read & follow direction 0  Write a sentence 0  Copy design 0  Total score 20        05/19/2023    1:29 PM  6CIT Screen  What Year? 4 points  What month? 0 points  What time? 0 points  Count back from 20 0 points  Months in reverse 2 points  Repeat phrase 2  points  Total Score 8 points    Immunizations Immunization History  Administered Date(s) Administered   Fluad Quad(high Dose 65+) 06/03/2019, 06/29/2020, 07/01/2021, 06/23/2022   Influenza, High Dose Seasonal PF 07/10/2017, 07/04/2018   Influenza-Unspecified 07/11/2016   PFIZER Comirnaty(Gray Top)Covid-19 Tri-Sucrose Vaccine 06/23/2022   PFIZER(Purple Top)SARS-COV-2 Vaccination 11/17/2019, 12/10/2019, 07/29/2020   Pneumococcal Conjugate-13 05/26/2014   Pneumococcal Polysaccharide-23 09/15/2016   Respiratory Syncytial Virus Vaccine,Recomb Aduvanted(Arexvy)  09/05/2022   Td 02/11/2009   Tdap 06/01/2022   Zoster Recombinant(Shingrix) 12/30/2020, 04/15/2021   Zoster, Live 02/15/2010    TDAP status: Up to date  Flu Vaccine status: Due, Education has been provided regarding the importance of this vaccine. Advised may receive this vaccine at local pharmacy or Health Dept. Aware to provide a copy of the vaccination record if obtained from local pharmacy or Health Dept. Verbalized acceptance and understanding.  Pneumococcal vaccine status: Up to date  Covid-19 vaccine status: Information provided on how to obtain vaccines.   Qualifies for Shingles Vaccine? Yes   Zostavax completed Yes   Shingrix Completed?: Yes  Screening Tests Health Maintenance  Topic Date Due   INFLUENZA VACCINE  05/04/2023   Medicare Annual Wellness (AWV)  05/18/2024   DTaP/Tdap/Td (3 - Td or Tdap) 06/01/2032   Pneumonia Vaccine 38+ Years old  Completed   DEXA SCAN  Completed   Zoster Vaccines- Shingrix  Completed   HPV VACCINES  Aged Out   COVID-19 Vaccine  Discontinued    Health Maintenance  Health Maintenance Due  Topic Date Due   INFLUENZA VACCINE  05/04/2023    Colorectal cancer screening: No longer required.   Mammogram status: Completed 02/14/2022. Repeat every year  Bone Density status: Completed 06/05/2019.  Lung Cancer Screening: (Low Dose CT Chest recommended if Age 34-80 years, 20 pack-year currently smoking OR have quit w/in 15years.) does not qualify.   Lung Cancer Screening Referral: no  Additional Screening:  Hepatitis C Screening: does not qualify;   Vision Screening: Recommended annual ophthalmology exams for early detection of glaucoma and other disorders of the eye. Is the patient up to date with their annual eye exam?  Yes  Who is the provider or what is the name of the office in which the patient attends annual eye exams? [facility] If pt is not established with a provider, would they like to be referred to a provider to  establish care? No .   Dental Screening: Recommended annual dental exams for proper oral hygiene  Diabetic Foot Exam: n/a  Community Resource Referral / Chronic Care Management: CRR required this visit?  No   CCM required this visit?  No     Plan:     I have personally reviewed and noted the following in the patient's chart:   Medical and social history Use of alcohol, tobacco or illicit drugs  Current medications and supplements including opioid prescriptions. Patient is not currently taking opioid prescriptions. Functional ability and status Nutritional status Physical activity Advanced directives List of other physicians Hospitalizations, surgeries, and ER visits in previous 12 months Vitals Screenings to include cognitive, depression, and falls Referrals and appointments  In addition, I have reviewed and discussed with patient certain preventive protocols, quality metrics, and best practice recommendations. A written personalized care plan for preventive services as well as general preventive health recommendations were provided to patient.     Barb Merino, LPN   [id number]   After Visit Summary: (MyChart) Due to this being a telephonic visit, the after  visit summary with patients personalized plan was offered to patient via MyChart   Nurse Notes: none

## 2023-05-19 NOTE — Patient Instructions (Signed)
Ms. Bucknam , Thank you for taking time to come for your Medicare Wellness Visit. I appreciate your ongoing commitment to your health goals. Please review the following plan we discussed and let me know if I can assist you in the future.   Referrals/Orders/Follow-Ups/Clinician Recommendations: none  This is a list of the screening recommended for you and due dates:  Health Maintenance  Topic Date Due   Flu Shot  05/04/2023   Medicare Annual Wellness Visit  05/18/2024   DTaP/Tdap/Td vaccine (3 - Td or Tdap) 06/01/2032   Pneumonia Vaccine  Completed   DEXA scan (bone density measurement)  Completed   Zoster (Shingles) Vaccine  Completed   HPV Vaccine  Aged Out   COVID-19 Vaccine  Discontinued    Advanced directives: (ACP Link)Information on Advanced Care Planning can be found at [facility] Advance Health Care Directives Advance Health Care Directives ([email or web address]   Next Medicare Annual Wellness Visit scheduled for next year: Yes  Preventive Care 65 Years and Older, Female Preventive care refers to lifestyle choices and visits with your health care provider that can promote health and wellness. What does preventive care include? A yearly physical exam. This is also called an annual well check. Dental exams once or twice a year. Routine eye exams. Ask your health care provider how often you should have your eyes checked. Personal lifestyle choices, including: Daily care of your teeth and gums. Regular physical activity. Eating a healthy diet. Avoiding tobacco and drug use. Limiting alcohol use. Practicing safe sex. Taking low-dose aspirin every day. Taking vitamin and mineral supplements as recommended by your health care provider. What happens during an annual well check? The services and screenings done by your health care provider during your annual well check will depend on your age, overall health, lifestyle risk factors, and family history of  disease. Counseling  Your health care provider may ask you questions about your: Alcohol use. Tobacco use. Drug use. Emotional well-being. Home and relationship well-being. Sexual activity. Eating habits. History of falls. Memory and ability to understand (cognition). Work and work Astronomer. Reproductive health. Screening  You may have the following tests or measurements: Height, weight, and BMI. Blood pressure. Lipid and cholesterol levels. These may be checked every 5 years, or more frequently if you are over 38 years old. Skin check. Lung cancer screening. You may have this screening every year starting at age 28 if you have a 30-pack-year history of smoking and currently smoke or have quit within the past 15 years. Fecal occult blood test (FOBT) of the stool. You may have this test every year starting at age 55. Flexible sigmoidoscopy or colonoscopy. You may have a sigmoidoscopy every 5 years or a colonoscopy every 10 years starting at age 21. Hepatitis C blood test. Hepatitis B blood test. Sexually transmitted disease (STD) testing. Diabetes screening. This is done by checking your blood sugar (glucose) after you have not eaten for a while (fasting). You may have this done every 1-3 years. Bone density scan. This is done to screen for osteoporosis. You may have this done starting at age 57. Mammogram. This may be done every 1-2 years. Talk to your health care provider about how often you should have regular mammograms. Talk with your health care provider about your test results, treatment options, and if necessary, the need for more tests. Vaccines  Your health care provider may recommend certain vaccines, such as: Influenza vaccine. This is recommended every year. Tetanus, diphtheria, and  acellular pertussis (Tdap, Td) vaccine. You may need a Td booster every 10 years. Zoster vaccine. You may need this after age 30. Pneumococcal 13-valent conjugate (PCV13) vaccine. One  dose is recommended after age 2. Pneumococcal polysaccharide (PPSV23) vaccine. One dose is recommended after age 71. Talk to your health care provider about which screenings and vaccines you need and how often you need them. This information is not intended to replace advice given to you by your health care provider. Make sure you discuss any questions you have with your health care provider. Document Released: 10/16/2015 Document Revised: 06/08/2016 Document Reviewed: 07/21/2015 Elsevier Interactive Patient Education  2017 [employer].  Fall Prevention in the Home Falls can cause injuries. They can happen to people of all ages. There are many things you can do to make your home safe and to help prevent falls. What can I do on the outside of my home? Regularly fix the edges of walkways and driveways and fix any cracks. Remove anything that might make you trip as you walk through a door, such as a raised step or threshold. Trim any bushes or trees on the path to your home. Use bright outdoor lighting. Clear any walking paths of anything that might make someone trip, such as rocks or tools. Regularly check to see if handrails are loose or broken. Make sure that both sides of any steps have handrails. Any raised decks and porches should have guardrails on the edges. Have any leaves, snow, or ice cleared regularly. Use sand or salt on walking paths during winter. Clean up any spills in your garage right away. This includes oil or grease spills. What can I do in the bathroom? Use night lights. Install grab bars by the toilet and in the tub and shower. Do not use towel bars as grab bars. Use non-skid mats or decals in the tub or shower. If you need to sit down in the shower, use a plastic, non-slip stool. Keep the floor dry. Clean up any water that spills on the floor as soon as it happens. Remove soap buildup in the tub or shower regularly. Attach bath mats securely with double-sided  non-slip rug tape. Do not have throw rugs and other things on the floor that can make you trip. What can I do in the bedroom? Use night lights. Make sure that you have a light by your bed that is easy to reach. Do not use any Berke or blankets that are too big for your bed. They should not hang down onto the floor. Have a firm chair that has side arms. You can use this for support while you get dressed. Do not have throw rugs and other things on the floor that can make you trip. What can I do in the kitchen? Clean up any spills right away. Avoid walking on wet floors. Keep items that you use a lot in easy-to-reach places. If you need to reach something above you, use a strong step stool that has a grab bar. Keep electrical cords out of the way. Do not use floor polish or wax that makes floors slippery. If you must use wax, use non-skid floor wax. Do not have throw rugs and other things on the floor that can make you trip. What can I do with my stairs? Do not leave any items on the stairs. Make sure that there are handrails on both sides of the stairs and use them. Fix handrails that are broken or loose. Make sure that  handrails are as long as the stairways. Check any carpeting to make sure that it is firmly attached to the stairs. Fix any carpet that is loose or worn. Avoid having throw rugs at the top or bottom of the stairs. If you do have throw rugs, attach them to the floor with carpet tape. Make sure that you have a light switch at the top of the stairs and the bottom of the stairs. If you do not have them, ask someone to add them for you. What else can I do to help prevent falls? Wear shoes that: Do not have high heels. Have rubber bottoms. Are comfortable and fit you well. Are closed at the toe. Do not wear sandals. If you use a stepladder: Make sure that it is fully opened. Do not climb a closed stepladder. Make sure that both sides of the stepladder are locked into place. Ask  someone to hold it for you, if possible. Clearly mark and make sure that you can see: Any grab bars or handrails. First and last steps. Where the edge of each step is. Use tools that help you move around (mobility aids) if they are needed. These include: Canes. Walkers. Scooters. Crutches. Turn on the lights when you go into a dark area. Replace any light bulbs as soon as they burn out. Set up your furniture so you have a clear path. Avoid moving your furniture around. If any of your floors are uneven, fix them. If there are any pets around you, be aware of where they are. Review your medicines with your doctor. Some medicines can make you feel dizzy. This can increase your chance of falling. Ask your doctor what other things that you can do to help prevent falls. This information is not intended to replace advice given to you by your health care provider. Make sure you discuss any questions you have with your health care provider. Document Released: 07/16/2009 Document Revised: 02/25/2016 Document Reviewed: 10/24/2014 Elsevier Interactive Patient Education  2017 [employer].

## 2023-06-27 DIAGNOSIS — J988 Other specified respiratory disorders: Secondary | ICD-10-CM | POA: Diagnosis not present

## 2023-06-27 DIAGNOSIS — Z6823 Body mass index (BMI) 23.0-23.9, adult: Secondary | ICD-10-CM | POA: Diagnosis not present

## 2023-06-29 DIAGNOSIS — D485 Neoplasm of uncertain behavior of skin: Secondary | ICD-10-CM | POA: Diagnosis not present

## 2023-06-29 DIAGNOSIS — Z08 Encounter for follow-up examination after completed treatment for malignant neoplasm: Secondary | ICD-10-CM | POA: Diagnosis not present

## 2023-06-29 DIAGNOSIS — L853 Xerosis cutis: Secondary | ICD-10-CM | POA: Diagnosis not present

## 2023-06-29 DIAGNOSIS — D2262 Melanocytic nevi of left upper limb, including shoulder: Secondary | ICD-10-CM | POA: Diagnosis not present

## 2023-06-29 DIAGNOSIS — D2271 Melanocytic nevi of right lower limb, including hip: Secondary | ICD-10-CM | POA: Diagnosis not present

## 2023-06-29 DIAGNOSIS — D225 Melanocytic nevi of trunk: Secondary | ICD-10-CM | POA: Diagnosis not present

## 2023-06-29 DIAGNOSIS — L821 Other seborrheic keratosis: Secondary | ICD-10-CM | POA: Diagnosis not present

## 2023-06-29 DIAGNOSIS — Z85828 Personal history of other malignant neoplasm of skin: Secondary | ICD-10-CM | POA: Diagnosis not present

## 2023-06-29 DIAGNOSIS — D2261 Melanocytic nevi of right upper limb, including shoulder: Secondary | ICD-10-CM | POA: Diagnosis not present

## 2023-06-29 DIAGNOSIS — D2272 Melanocytic nevi of left lower limb, including hip: Secondary | ICD-10-CM | POA: Diagnosis not present

## 2023-07-27 DIAGNOSIS — D2372 Other benign neoplasm of skin of left lower limb, including hip: Secondary | ICD-10-CM | POA: Diagnosis not present

## 2023-07-27 DIAGNOSIS — D2272 Melanocytic nevi of left lower limb, including hip: Secondary | ICD-10-CM | POA: Diagnosis not present

## 2023-08-10 DIAGNOSIS — H4423 Degenerative myopia, bilateral: Secondary | ICD-10-CM | POA: Diagnosis not present

## 2023-08-10 DIAGNOSIS — H353134 Nonexudative age-related macular degeneration, bilateral, advanced atrophic with subfoveal involvement: Secondary | ICD-10-CM | POA: Diagnosis not present

## 2023-08-10 DIAGNOSIS — H2512 Age-related nuclear cataract, left eye: Secondary | ICD-10-CM | POA: Diagnosis not present

## 2023-08-10 DIAGNOSIS — Z961 Presence of intraocular lens: Secondary | ICD-10-CM | POA: Diagnosis not present

## 2023-08-15 DIAGNOSIS — Z23 Encounter for immunization: Secondary | ICD-10-CM | POA: Diagnosis not present

## 2023-10-23 ENCOUNTER — Other Ambulatory Visit: Payer: Self-pay | Admitting: Family Medicine

## 2023-10-24 ENCOUNTER — Encounter: Payer: Self-pay | Admitting: Family Medicine

## 2023-10-24 ENCOUNTER — Ambulatory Visit (INDEPENDENT_AMBULATORY_CARE_PROVIDER_SITE_OTHER): Payer: Medicare Other | Admitting: Family Medicine

## 2023-10-24 VITALS — BP 177/87 | HR 71 | Temp 98.4°F | Ht 62.0 in | Wt 141.6 lb

## 2023-10-24 DIAGNOSIS — I10 Essential (primary) hypertension: Secondary | ICD-10-CM | POA: Diagnosis not present

## 2023-10-24 DIAGNOSIS — Z131 Encounter for screening for diabetes mellitus: Secondary | ICD-10-CM

## 2023-10-24 DIAGNOSIS — E782 Mixed hyperlipidemia: Secondary | ICD-10-CM | POA: Diagnosis not present

## 2023-10-24 LAB — LDL CHOLESTEROL, DIRECT: Direct LDL: 103 mg/dL

## 2023-10-24 LAB — BASIC METABOLIC PANEL
BUN: 12 mg/dL (ref 6–23)
CO2: 30 meq/L (ref 19–32)
Calcium: 10.1 mg/dL (ref 8.4–10.5)
Chloride: 105 meq/L (ref 96–112)
Creatinine, Ser: 0.81 mg/dL (ref 0.40–1.20)
GFR: 66.39 mL/min (ref >60.00)
Glucose, Bld: 101 mg/dL — ABNORMAL HIGH (ref 70–99)
Potassium: 3.6 meq/L (ref 3.5–5.1)
Sodium: 142 meq/L (ref 135–145)

## 2023-10-24 LAB — HEMOGLOBIN A1C: Hgb A1c MFr Bld: 5.6 % (ref 4.6–6.5)

## 2023-10-24 NOTE — Progress Notes (Signed)
Established Patient Office Visit   Subjective:  Patient ID: Heidi Sanchez, female    DOB: 1940-10-27  Age: 83 y.o. MRN: [id number]  Chief Complaint  Patient presents with   Hypertension   Hyperlipidemia    Hypertension Pertinent negatives include no blurred vision or headaches.  Hyperlipidemia Pertinent negatives include no myalgias.   Encounter Diagnoses  Name Primary?   White coat syndrome with diagnosis of hypertension Yes   Mixed hyperlipidemia    Screening for diabetes mellitus    Doing well.  Accompanied by her husband today.  They continue to exercise together.  Husband is ensuring patient's compliance with her medications.  Has not been checking her pressures recently.  She is a bit upset with the cold weather.   Review of Systems  Constitutional: Negative.   HENT: Negative.    Eyes:  Negative for blurred vision, discharge and redness.  Respiratory: Negative.    Cardiovascular: Negative.   Gastrointestinal:  Negative for abdominal pain.  Genitourinary: Negative.   Musculoskeletal: Negative.  Negative for myalgias.  Skin:  Negative for rash.  Neurological:  Negative for tingling, loss of consciousness, weakness and headaches.  Endo/Heme/Allergies:  Negative for polydipsia.     Current Outpatient Medications:    amLODipine (NORVASC) 5 MG tablet, TAKE 1 TABLET (5 MG TOTAL) BY MOUTH DAILY., Disp: 90 tablet, Rfl: 2   Cholecalciferol (VITAMIN D) 50 MCG (2000 UT) CAPS, Take 2,000 Units by mouth daily., Disp: , Rfl:    clotrimazole (LOTRIMIN) 1 % cream, APPLY 1 APPLICATION TOPICALLY 2 (TWO) TIMES DAILY. FOR 10 DAYS. (Patient not taking: Reported on 05/19/2023), Disp: 60 g, Rfl: 0   diphenhydrAMINE (BENADRYL) 25 MG tablet, Take 25 mg by mouth as needed., Disp: , Rfl:    fluorouracil (EFUDEX) 5 % cream, Apply topically 2 (two) times daily. (Patient not taking: Reported on 05/19/2023), Disp: , Rfl:    fluticasone (FLONASE) 50 MCG/ACT nasal spray, SPRAY 2 SPRAYS INTO EACH  NOSTRIL EVERY DAY, Disp: 48 mL, Rfl: 2   lisinopril (ZESTRIL) 20 MG tablet, TAKE 1 TABLET BY MOUTH EVERY DAY, Disp: 90 tablet, Rfl: 3   Multiple Vitamins-Minerals (OCUVITE PRESERVISION) TABS, 1 tablet 2 (two) times daily. Use as directed, Disp: , Rfl:    Omega-3 Fatty Acids (FISH OIL) 1000 MG CAPS, Take 1 capsule (1,000 mg total) by mouth daily., Disp: 90 capsule, Rfl: 3   simvastatin (ZOCOR) 40 MG tablet, TAKE 1 TABLET BY MOUTH EVERYDAY AT BEDTIME, Disp: 90 tablet, Rfl: 3   Objective:     BP (!) 177/87   Pulse 71   Temp 98.4 F (36.9 C)   Ht 5\' 2"  (1.575 m)   Wt 141 lb 9.6 oz (64.2 kg)   SpO2 98%   BMI 25.90 kg/m  BP Readings from Last 3 Encounters:  10/24/23 (!) 177/87  04/19/23 136/82  01/11/23 (!) 141/86   Wt Readings from Last 3 Encounters:  10/24/23 141 lb 9.6 oz (64.2 kg)  04/19/23 140 lb (63.5 kg)  01/11/23 139 lb 6.4 oz (63.2 kg)      Physical Exam Constitutional:      General: She is not in acute distress.    Appearance: Normal appearance. She is not ill-appearing, toxic-appearing or diaphoretic.  HENT:     Head: Normocephalic and atraumatic.     Right Ear: External ear normal.     Left Ear: External ear normal.     Mouth/Throat:     Mouth: Mucous membranes are moist.  Pharynx: Oropharynx is clear. No oropharyngeal exudate or posterior oropharyngeal erythema.  Eyes:     General: No scleral icterus.       Right eye: No discharge.        Left eye: No discharge.     Extraocular Movements: Extraocular movements intact.     Conjunctiva/sclera: Conjunctivae normal.     Pupils: Pupils are equal, round, and reactive to light.  Cardiovascular:     Rate and Rhythm: Normal rate and regular rhythm.  Pulmonary:     Effort: Pulmonary effort is normal. No respiratory distress.     Breath sounds: Normal breath sounds.  Abdominal:     General: Bowel sounds are normal.     Tenderness: There is no abdominal tenderness. There is no guarding.  Musculoskeletal:      Cervical back: No rigidity or tenderness.  Skin:    General: Skin is warm and dry.  Neurological:     Mental Status: She is alert and oriented to person, place, and time.  Psychiatric:        Mood and Affect: Mood normal.        Behavior: Behavior normal.      No results found for any visits on 10/24/23.    The ASCVD Risk score (Arnett DK, et al., 2019) failed to calculate for the following reasons:   The 2019 ASCVD risk score is only valid for ages 22 to 68    Assessment & Plan:   White coat syndrome with diagnosis of hypertension -     Basic metabolic panel  Mixed hyperlipidemia -     LDL cholesterol, direct  Screening for diabetes mellitus -     Basic metabolic panel -     Hemoglobin A1c    Return in about 4 weeks (around 11/21/2023), or Please bring home BP cuff with you next visit..  Check and record blood pressures and follow-up in 6 weeks.  Information on managing hypertension was given.  Mliss Sax, MD

## 2023-11-28 ENCOUNTER — Encounter: Payer: Self-pay | Admitting: Family Medicine

## 2023-11-28 ENCOUNTER — Ambulatory Visit (INDEPENDENT_AMBULATORY_CARE_PROVIDER_SITE_OTHER): Payer: Medicare Other | Admitting: Family Medicine

## 2023-11-28 VITALS — BP 140/82 | HR 85 | Temp 98.0°F | Ht 62.0 in | Wt 142.2 lb

## 2023-11-28 DIAGNOSIS — I10 Essential (primary) hypertension: Secondary | ICD-10-CM | POA: Diagnosis not present

## 2023-11-28 NOTE — Progress Notes (Signed)
 Established Patient Office Visit   Subjective:  Patient ID: Heidi Sanchez, female    DOB: December 31, 1940  Age: 83 y.o. MRN: [id number]  Chief Complaint  Patient presents with   Hypertension    4 week follow up. Pt home bp monitor is running a bit high. In office reading is 20-30 points lower.     Hypertension Pertinent negatives include no blurred vision.   Encounter Diagnoses  Name Primary?   Essential hypertension Yes   Continues amlodipine 5 mg and lisinopril 20 mg for hypertension.  Brings in her cuff today which read 150/82 compared to our reading of 140/82.  Brings in several blood pressures from home running in the 120s to 150s over 70s to 80s.   Review of Systems  Constitutional: Negative.   HENT: Negative.    Eyes:  Negative for blurred vision, discharge and redness.  Respiratory: Negative.    Cardiovascular: Negative.   Gastrointestinal:  Negative for abdominal pain.  Genitourinary: Negative.   Musculoskeletal: Negative.  Negative for myalgias.  Skin:  Negative for rash.  Neurological:  Negative for tingling, loss of consciousness and weakness.  Endo/Heme/Allergies:  Negative for polydipsia.     Current Outpatient Medications:    amLODipine (NORVASC) 5 MG tablet, TAKE 1 TABLET (5 MG TOTAL) BY MOUTH DAILY., Disp: 90 tablet, Rfl: 2   Cholecalciferol (VITAMIN D) 50 MCG (2000 UT) CAPS, Take 2,000 Units by mouth daily., Disp: , Rfl:    diphenhydrAMINE (BENADRYL) 25 MG tablet, Take 25 mg by mouth as needed., Disp: , Rfl:    fluticasone (FLONASE) 50 MCG/ACT nasal spray, SPRAY 2 SPRAYS INTO EACH NOSTRIL EVERY DAY, Disp: 48 mL, Rfl: 2   lisinopril (ZESTRIL) 20 MG tablet, TAKE 1 TABLET BY MOUTH EVERY DAY, Disp: 90 tablet, Rfl: 3   Multiple Vitamins-Minerals (OCUVITE PRESERVISION) TABS, 1 tablet 2 (two) times daily. Use as directed, Disp: , Rfl:    Omega-3 Fatty Acids (FISH OIL) 1000 MG CAPS, Take 1 capsule (1,000 mg total) by mouth daily., Disp: 90 capsule, Rfl: 3    simvastatin (ZOCOR) 40 MG tablet, TAKE 1 TABLET BY MOUTH EVERYDAY AT BEDTIME, Disp: 90 tablet, Rfl: 3   clotrimazole (LOTRIMIN) 1 % cream, APPLY 1 APPLICATION TOPICALLY 2 (TWO) TIMES DAILY. FOR 10 DAYS. (Patient not taking: Reported on 11/28/2023), Disp: 60 g, Rfl: 0   fluorouracil (EFUDEX) 5 % cream, Apply topically 2 (two) times daily. (Patient not taking: Reported on 11/28/2023), Disp: , Rfl:    Objective:     BP (!) 140/82   Pulse 85   Temp 98 F (36.7 C)   Ht 5\' 2"  (1.575 m)   Wt 142 lb 3.2 oz (64.5 kg)   SpO2 98%   BMI 26.01 kg/m    Physical Exam Constitutional:      General: She is not in acute distress.    Appearance: Normal appearance. She is not ill-appearing, toxic-appearing or diaphoretic.  HENT:     Head: Normocephalic and atraumatic.     Right Ear: External ear normal.     Left Ear: External ear normal.  Eyes:     General: No scleral icterus.       Right eye: No discharge.        Left eye: No discharge.     Extraocular Movements: Extraocular movements intact.     Conjunctiva/sclera: Conjunctivae normal.  Cardiovascular:     Rate and Rhythm: Normal rate and regular rhythm.  Pulmonary:     Effort: Pulmonary effort  is normal. No respiratory distress.     Breath sounds: Normal breath sounds.  Abdominal:     General: Bowel sounds are normal.  Musculoskeletal:     Cervical back: No rigidity or tenderness.     Right lower leg: No edema.     Left lower leg: No edema.     Comments: Swelling in lower extremities without edema.  Skin:    General: Skin is warm and dry.  Neurological:     Mental Status: She is alert and oriented to person, place, and time.  Psychiatric:        Mood and Affect: Mood normal.        Behavior: Behavior normal.      No results found for any visits on 11/28/23.    The ASCVD Risk score (Arnett DK, et al., 2019) failed to calculate for the following reasons:   The 2019 ASCVD risk score is only valid for ages 82 to 2     Assessment & Plan:   Essential hypertension    Return in about 3 months (around 02/25/2024), or Bring cuff with you next visit.  Continue current medications.  Considering that her home cuff is reading higher I think recorded pressures from home are okay.  Continue current regimen and follow-up in 3 months.  Information was given on managing hypertension.  Mliss Sax, MD

## 2024-01-04 ENCOUNTER — Other Ambulatory Visit: Payer: Self-pay | Admitting: Family Medicine

## 2024-01-04 DIAGNOSIS — I10 Essential (primary) hypertension: Secondary | ICD-10-CM

## 2024-01-04 MED ORDER — LISINOPRIL 20 MG PO TABS
20.0000 mg | ORAL_TABLET | Freq: Every day | ORAL | 0 refills | Status: DC
Start: 2024-01-04 — End: 2024-04-01

## 2024-01-04 NOTE — Telephone Encounter (Signed)
 Copied from CRM [id number]. Topic: Clinical - Medication Refill >> Jan 04, 2024  3:53 PM [place] A wrote: Most Recent Primary Care Visit:  Provider: Mliss Sax  Department: [facility]  Visit Type: OFFICE VISIT  Date: 11/28/2023  Medication: lisinopril (ZESTRIL) 20 MG tablet  Has the patient contacted their pharmacy? Yes (Agent: If no, request that the patient contact the pharmacy for the refill. If patient does not wish to contact the pharmacy document the reason why and proceed with request.) (Agent: If yes, when and what did the pharmacy advise?) Advised prescriber would not refill  Is this the correct pharmacy for this prescription? Yes If no, delete pharmacy and type the correct one.  This is the patient's preferred pharmacy:  [id number] [place], [place] - [address] [place] [address] Phone: [phone] Fax: [phone]   Has the prescription been filled recently? No  Is the patient out of the medication? No  Has the patient been seen for an appointment in the last year OR does the patient have an upcoming appointment? Yes  Can we respond through MyChart? Yes  Agent: Please be advised that Rx refills may take up to 3 business days. We ask that you follow-up with your pharmacy.

## 2024-01-05 ENCOUNTER — Other Ambulatory Visit: Payer: Self-pay | Admitting: Family Medicine

## 2024-01-05 DIAGNOSIS — I10 Essential (primary) hypertension: Secondary | ICD-10-CM

## 2024-02-13 DIAGNOSIS — H43813 Vitreous degeneration, bilateral: Secondary | ICD-10-CM | POA: Diagnosis not present

## 2024-02-13 DIAGNOSIS — H4423 Degenerative myopia, bilateral: Secondary | ICD-10-CM | POA: Diagnosis not present

## 2024-02-13 DIAGNOSIS — H353134 Nonexudative age-related macular degeneration, bilateral, advanced atrophic with subfoveal involvement: Secondary | ICD-10-CM | POA: Diagnosis not present

## 2024-02-13 DIAGNOSIS — H2512 Age-related nuclear cataract, left eye: Secondary | ICD-10-CM | POA: Diagnosis not present

## 2024-02-27 ENCOUNTER — Encounter: Payer: Self-pay | Admitting: Family Medicine

## 2024-02-27 ENCOUNTER — Ambulatory Visit (INDEPENDENT_AMBULATORY_CARE_PROVIDER_SITE_OTHER): Payer: Medicare Other | Admitting: Family Medicine

## 2024-02-27 VITALS — BP 124/68 | HR 76 | Temp 97.7°F | Ht 62.0 in | Wt 143.4 lb

## 2024-02-27 DIAGNOSIS — I10 Essential (primary) hypertension: Secondary | ICD-10-CM

## 2024-02-27 NOTE — Progress Notes (Signed)
 Established Patient Office Visit   Subjective:  Patient ID: Heidi Sanchez, female    DOB: 19-Mar-1941  Age: 83 y.o. MRN: [id number]  Chief Complaint  Patient presents with   Medical Management of Chronic Issues    Follow up. Pt is not fasting.     HPI Encounter Diagnoses  Name Primary?   White coat syndrome with diagnosis of hypertension Yes   For follow-up of hypertension treated with amlodipine  5 and lisinopril  20.  No issues taking these medications.  No shortness of breath or chest pain.  She is walking daily with her husband for a mile and a half.  There is some swelling in her legs.  Brings in a list of blood pressures running 120s to 130s over 70s to 80s.   Review of Systems  Constitutional: Negative.   HENT: Negative.    Eyes:  Negative for blurred vision, discharge and redness.  Respiratory: Negative.  Negative for shortness of breath.   Cardiovascular: Negative.  Negative for chest pain.  Gastrointestinal:  Negative for abdominal pain.  Genitourinary: Negative.   Musculoskeletal: Negative.  Negative for myalgias.  Skin:  Negative for rash.  Neurological:  Negative for tingling, loss of consciousness and weakness.  Endo/Heme/Allergies:  Negative for polydipsia.     Current Outpatient Medications:    amLODipine  (NORVASC ) 5 MG tablet, TAKE 1 TABLET (5 MG TOTAL) BY MOUTH DAILY., Disp: 90 tablet, Rfl: 2   Cholecalciferol (VITAMIN D ) 50 MCG (2000 UT) CAPS, Take 2,000 Units by mouth daily., Disp: , Rfl:    diphenhydrAMINE  (BENADRYL ) 25 MG tablet, Take 25 mg by mouth as needed., Disp: , Rfl:    fluticasone  (FLONASE ) 50 MCG/ACT nasal spray, SPRAY 2 SPRAYS INTO EACH NOSTRIL EVERY DAY, Disp: 48 mL, Rfl: 2   lisinopril  (ZESTRIL ) 20 MG tablet, Take 1 tablet (20 mg total) by mouth daily., Disp: 90 tablet, Rfl: 0   Multiple Vitamins-Minerals (OCUVITE PRESERVISION) TABS, 1 tablet 2 (two) times daily. Use as directed, Disp: , Rfl:    Omega-3 Fatty Acids (FISH OIL ) 1000 MG CAPS,  Take 1 capsule (1,000 mg total) by mouth daily., Disp: 90 capsule, Rfl: 3   simvastatin  (ZOCOR ) 40 MG tablet, TAKE 1 TABLET BY MOUTH EVERYDAY AT BEDTIME, Disp: 90 tablet, Rfl: 3   clotrimazole  (LOTRIMIN ) 1 % cream, APPLY 1 APPLICATION TOPICALLY 2 (TWO) TIMES DAILY. FOR 10 DAYS. (Patient not taking: Reported on 05/19/2023), Disp: 60 g, Rfl: 0   fluorouracil (EFUDEX) 5 % cream, Apply topically 2 (two) times daily. (Patient not taking: Reported on 05/19/2023), Disp: , Rfl:    Objective:     BP 124/68 (Cuff Size: Normal)   Pulse 76   Temp 97.7 F (36.5 C) (Temporal)   Ht 5\' 2"  (1.575 m)   Wt 143 lb 6.4 oz (65 kg)   SpO2 98%   BMI 26.23 kg/m    Physical Exam Constitutional:      General: She is not in acute distress.    Appearance: Normal appearance. She is not ill-appearing, toxic-appearing or diaphoretic.  HENT:     Head: Normocephalic and atraumatic.     Right Ear: External ear normal.     Left Ear: External ear normal.  Eyes:     General: No scleral icterus.       Right eye: No discharge.        Left eye: No discharge.     Extraocular Movements: Extraocular movements intact.     Conjunctiva/sclera: Conjunctivae normal.  Cardiovascular:  Rate and Rhythm: Normal rate and regular rhythm.  Pulmonary:     Effort: Pulmonary effort is normal. No respiratory distress.     Breath sounds: Normal breath sounds. No wheezing or rales.  Musculoskeletal:     Cervical back: No rigidity or tenderness.     Right lower leg: No edema.     Left lower leg: No edema.  Skin:    General: Skin is warm and dry.  Neurological:     Mental Status: She is alert and oriented to person, place, and time.  Psychiatric:        Mood and Affect: Mood normal.        Behavior: Behavior normal.      No results found for any visits on 02/27/24.    The ASCVD Risk score (Arnett DK, et al., 2019) failed to calculate for the following reasons:   The 2019 ASCVD risk score is only valid for ages 45 to  96    Assessment & Plan:   White coat syndrome with diagnosis of hypertension    Return in about 6 months (around 08/29/2024), or if symptoms worsen or fail to improve, for chronic disease follow-up.  Continue current medications.  There was no edema in her legs, only swelling.  Believe that the swelling is likely due to venous incompetence.  Amlodipine  is more likely to cause edema at the higher dose of 10 mg.  No signs of CHF.  Tonna Frederic, MD

## 2024-03-30 ENCOUNTER — Other Ambulatory Visit: Payer: Self-pay | Admitting: Family Medicine

## 2024-03-30 DIAGNOSIS — I10 Essential (primary) hypertension: Secondary | ICD-10-CM

## 2024-05-19 ENCOUNTER — Other Ambulatory Visit: Payer: Self-pay | Admitting: Family Medicine

## 2024-05-19 DIAGNOSIS — I10 Essential (primary) hypertension: Secondary | ICD-10-CM

## 2024-05-24 ENCOUNTER — Ambulatory Visit (INDEPENDENT_AMBULATORY_CARE_PROVIDER_SITE_OTHER): Payer: Medicare Other

## 2024-05-24 DIAGNOSIS — Z0000 Encounter for general adult medical examination without abnormal findings: Secondary | ICD-10-CM | POA: Diagnosis not present

## 2024-05-24 NOTE — Patient Instructions (Signed)
 Heidi Sanchez , Thank you for taking time out of your busy schedule to complete your Annual Wellness Visit with me. I enjoyed our conversation and look forward to speaking with you again next year. I, as well as your care team,  appreciate your ongoing commitment to your health goals. Please review the following plan we discussed and let me know if I can assist you in the future. Your Game plan/ To Do List    Referrals: If you haven't heard from the office you've been referred to, please reach out to them at the phone provided.   Follow up Visits: We will see or speak with you next year for your Next Medicare AWV with our clinical staff Have you seen your provider in the last 6 months (3 months if uncontrolled diabetes)? Yes  Clinician Recommendations:  Aim for 30 minutes of exercise or brisk walking, 6-8 glasses of water, and 5 servings of fruits and vegetables each day.       This is a list of the screenings recommended for you:  Health Maintenance  Topic Date Due   Flu Shot  05/03/2024   Medicare Annual Wellness Visit  05/24/2025   DTaP/Tdap/Td vaccine (3 - Td or Tdap) 06/01/2032   Pneumococcal Vaccine for age over 12  Completed   DEXA scan (bone density measurement)  Completed   Zoster (Shingles) Vaccine  Completed   HPV Vaccine  Aged Out   Meningitis B Vaccine  Aged Out   COVID-19 Vaccine  Discontinued    Advanced directives: (Copy Requested) Please bring a copy of your health care power of attorney and living will to the office to be added to your chart at your convenience. You can mail to [facility] [address] [place], [place] [address] or email to [email or web address] Advance Care Planning is important because it:  [x]  Makes sure you receive the medical care that is consistent with your values, goals, and preferences  [x]  It provides guidance to your family and loved ones and reduces their decisional burden about whether or not they are making the  right decisions based on your wishes.  Follow the link provided in your after visit summary or read over the paperwork we have mailed to you to help you started getting your Advance Directives in place. If you need assistance in completing these, please reach out to us  so that we can help you!  See attachments for Preventive Care and Fall Prevention Tips.

## 2024-05-24 NOTE — Progress Notes (Signed)
 Subjective:   Heidi Sanchez is a 83 y.o. who presents for a Medicare Wellness preventive visit.  As a reminder, Annual Wellness Visits don't include a physical exam, and some assessments may be limited, especially if this visit is performed virtually. We may recommend an in-person follow-up visit with your provider if needed.  Visit Complete: Virtual I connected with  Heidi Sanchez on 05/24/24 by a audio enabled telemedicine application and verified that I am speaking with the correct person using two identifiers.  Patient Location: Home  Provider Location: Office/Clinic  I discussed the limitations of evaluation and management by telemedicine. The patient expressed understanding and agreed to proceed.  Vital Signs: Because this visit was a virtual/telehealth visit, some criteria may be missing or patient reported. Any vitals not documented were not able to be obtained and vitals that have been documented are patient reported.  VideoError- Librarian, academic were attempted between this provider and patient, however failed, due to patient having technical difficulties OR patient did not have access to video capability.  We continued and completed visit with audio only.   Persons Participating in Visit: Patient.  AWV Questionnaire: No: Patient Medicare AWV questionnaire was not completed prior to this visit.  Cardiac Risk Factors include: advanced age (>37men, >30 women);dyslipidemia;hypertension     Objective:    Today's Vitals   There is no height or weight on file to calculate BMI.     05/24/2024    1:19 PM 05/19/2023    1:28 PM 01/11/2023    8:19 AM 05/17/2022   10:36 AM 05/11/2021    9:43 AM 04/29/2020    9:24 AM 04/24/2019    2:24 PM  Advanced Directives  Does Patient Have a Medical Advance Directive? No No No Yes No Yes Yes  Type of Theme park manager;Living will  Healthcare Power of [place];Living will Healthcare  Power of [place];Living will  Does patient want to make changes to medical advance directive?       No - Patient declined   Copy of Healthcare Power of Attorney in Chart?    No - copy requested  No - copy requested No - copy requested   Would patient like information on creating a medical advance directive?   No - Patient declined  No - Patient declined       Data saved with a previous flowsheet row definition    Current Medications (verified) Outpatient Encounter Medications as of 05/24/2024  Medication Sig   amLODipine  (NORVASC ) 5 MG tablet TAKE 1 TABLET (5 MG TOTAL) BY MOUTH DAILY. PLEASE SCHEDULE VISIT WITH NEW PROVIDER   Cholecalciferol (VITAMIN D ) 50 MCG (2000 UT) CAPS Take 2,000 Units by mouth daily.   diphenhydrAMINE  (BENADRYL ) 25 MG tablet Take 25 mg by mouth as needed.   fluticasone  (FLONASE ) 50 MCG/ACT nasal spray SPRAY 2 SPRAYS INTO EACH NOSTRIL EVERY DAY   lisinopril  (ZESTRIL ) 20 MG tablet TAKE 1 TABLET BY MOUTH EVERY DAY   Multiple Vitamins-Minerals (OCUVITE PRESERVISION) TABS 1 tablet 2 (two) times daily. Use as directed   Omega-3 Fatty Acids (FISH OIL ) 1000 MG CAPS Take 1 capsule (1,000 mg total) by mouth daily.   simvastatin  (ZOCOR ) 40 MG tablet TAKE 1 TABLET BY MOUTH EVERYDAY AT BEDTIME   No facility-administered encounter medications on file as of 05/24/2024.    Allergies (verified) Codeine   History: Past Medical History:  Diagnosis Date   Breast cancer (HCC)  Cancer [facility])    breast   Colonic polyp    Hyperlipidemia    Hypertension    Personal history of radiation therapy    Past Surgical History:  Procedure Laterality Date   BREAST BIOPSY     BREAST LUMPECTOMY Right    1998   CATARACT EXTRACTION W/PHACO Right 01/11/2023   Procedure: CATARACT EXTRACTION PHACO AND INTRAOCULAR LENS PLACEMENT (IOC) RIGHT;  Surgeon: Mittie Gaskin, MD;  Location: [facility];  Service: Ophthalmology;  Laterality: Right;  4.40 0:39.1   Family History   Problem Relation Age of Onset   Cancer Mother    Cancer Father    Social History   Socioeconomic History   Marital status: Married    Spouse name: Not on file   Number of children: Not on file   Years of education: Not on file   Highest education level: Not on file  Occupational History   Not on file  Tobacco Use   Smoking status: Never   Smokeless tobacco: Never  Vaping Use   Vaping status: Never Used  Substance and Sexual Activity   Alcohol use: No   Drug use: No   Sexual activity: Yes  Other Topics Concern   Not on file  Social History Narrative   HPOA is her husband, Shaneequa Bahner.     She would want CPR- no feeding tube or heroic measures.   Social Drivers of Corporate investment banker Strain: Low Risk  (05/24/2024)   Overall Financial Resource Strain (CARDIA)    Difficulty of Paying Living Expenses: Not hard at all  Food Insecurity: No Food Insecurity (05/24/2024)   Hunger Vital Sign    Worried About Running Out of Food in the Last Year: Never true    Ran Out of Food in the Last Year: Never true  Transportation Needs: No Transportation Needs (05/24/2024)   PRAPARE - Administrator, Civil Service (Medical): No    Lack of Transportation (Non-Medical): No  Physical Activity: Sufficiently Active (05/24/2024)   Exercise Vital Sign    Days of Exercise per Week: 5 days    Minutes of Exercise per Session: 30 min  Stress: No Stress Concern Present (05/24/2024)   [employer] of Occupational Health - Occupational Stress Questionnaire    Feeling of Stress: Not at all  Social Connections: Moderately Integrated (05/24/2024)   Social Connection and Isolation Panel    Frequency of Communication with Friends and Family: Three times a week    Frequency of Social Gatherings with Friends and Family: Once a week    Attends Religious Services: More than 4 times per year    Active Member of [employer] or Organizations: No    Attends Engineer, structural:  Never    Marital Status: Married    Tobacco Counseling Counseling given: Not Answered    Clinical Intake:  Pre-visit preparation completed: Yes  Pain : No/denies pain     Nutritional Risks: None Diabetes: No  Lab Results  Component Value Date   HGBA1C 5.6 10/24/2023     How often do you need to have someone help you when you read instructions, pamphlets, or other written materials from your doctor or pharmacy?: 1 - Never  Interpreter Needed?: No  Information entered by :: NAllen LPN   Activities of Daily Living     05/24/2024    1:14 PM  In your present state of health, do you have any difficulty performing the following activities:  Hearing?  1  Comment no hearing aids  Vision? 1  Comment legally blind  Difficulty concentrating or making decisions? 1  Comment talks with husband  Walking or climbing stairs? 0  Dressing or bathing? 0  Doing errands, shopping? 0  Preparing Food and eating ? N  Using the Toilet? N  In the past six months, have you accidently leaked urine? N  Do you have problems with loss of bowel control? N  Managing your Medications? N  Managing your Finances? N  Housekeeping or managing your Housekeeping? N    Patient Care Team: Berneta Elsie Sayre, MD as PCP - General (Family Medicine) Isenstein, Arin L, MD as Consulting Physician (Dermatology) Mittie Gaskin, MD as Referring Physician (Ophthalmology) Odelia Madison SAILOR, MD as Referring Physician (Ophthalmology) Francis La, DDS as Consulting Physician (Dentistry)  I have updated your Care Teams any recent Medical Services you may have received from other providers in the past year.     Assessment:   This is a routine wellness examination for [place].  Hearing/Vision screen Hearing Screening - Comments:: Slight hearing, but no hearing aids Vision Screening - Comments:: Regular eye exams   Goals Addressed             This Visit's Progress    Patient Stated        05/24/2024 maintain current health       Depression Screen     05/24/2024    1:21 PM 10/24/2023    9:04 AM 05/19/2023    1:29 PM 04/19/2023   10:13 AM 10/19/2022   10:08 AM 05/17/2022   10:35 AM 10/18/2021   10:33 AM  PHQ 2/9 Scores  PHQ - 2 Score 0 0 0 0 0 0 0  PHQ- 9 Score 0  0        Fall Risk     05/24/2024    1:20 PM 10/24/2023    9:04 AM 05/19/2023    1:29 PM 04/19/2023   10:08 AM 10/19/2022   10:08 AM  Fall Risk   Falls in the past year? 0 0 0 0 0  Number falls in past yr: 0 0 0 0 0  Injury with Fall? 0 0 0 0 0  Risk for fall due to : Medication side effect;Impaired vision No Fall Risks Medication side effect No Fall Risks No Fall Risks  Follow up Falls evaluation completed;Falls prevention discussed Falls evaluation completed Falls prevention discussed;Falls evaluation completed Falls evaluation completed Falls evaluation completed      Data saved with a previous flowsheet row definition    MEDICARE RISK AT HOME:  Medicare Risk at Home Any stairs in or around the home?: Yes If so, are there any without handrails?: No Home free of loose throw rugs in walkways, pet beds, electrical cords, etc?: Yes Adequate lighting in your home to reduce risk of falls?: Yes Life alert?: No Use of a cane, walker or w/c?: No Grab bars in the bathroom?: Yes Shower chair or bench in shower?: Yes Elevated toilet seat or a handicapped toilet?: Yes  TIMED UP AND GO:  Was the test performed?  No  Cognitive Function: 6CIT completed    09/15/2016   10:23 AM  MMSE - Mini Mental State Exam  Orientation to time 5   Orientation to Place 5   Registration 3   Attention/ Calculation 0   Recall 3   Language- name 2 objects 0   Language- repeat 1  Language- follow 3 step command 3  Language- read & follow direction 0   Write a sentence 0   Copy design 0   Total score 20      Data saved with a previous flowsheet row definition        05/24/2024    1:22 PM 05/19/2023    1:29 PM  6CIT  Screen  What Year? 0 points 4 points  What month? 3 points 0 points  What time? 3 points 0 points  Count back from 20 0 points 0 points  Months in reverse 4 points 2 points  Repeat phrase 8 points 2 points  Total Score 18 points 8 points    Immunizations Immunization History  Administered Date(s) Administered   Fluad Quad(high Dose 65+) 06/03/2019, 06/29/2020, 07/01/2021, 06/23/2022   Influenza, High Dose Seasonal PF 07/10/2017, 07/04/2018, 08/15/2023   Influenza-Unspecified 07/11/2016   PFIZER Comirnaty(Gray Top)Covid-19 Tri-Sucrose Vaccine 06/23/2022   PFIZER(Purple Top)SARS-COV-2 Vaccination 11/17/2019, 12/10/2019, 07/29/2020   Pneumococcal Conjugate-13 05/26/2014   Pneumococcal Polysaccharide-23 09/15/2016   Respiratory Syncytial Virus Vaccine,Recomb Aduvanted(Arexvy) 09/05/2022   Td 02/11/2009   Tdap 06/01/2022   Zoster Recombinant(Shingrix) 12/30/2020, 04/15/2021   Zoster, Live 02/15/2010    Screening Tests Health Maintenance  Topic Date Due   INFLUENZA VACCINE  05/03/2024   Medicare Annual Wellness (AWV)  05/24/2025   DTaP/Tdap/Td (3 - Td or Tdap) 06/01/2032   Pneumococcal Vaccine: 50+ Years  Completed   DEXA SCAN  Completed   Zoster Vaccines- Shingrix  Completed   HPV VACCINES  Aged Out   Meningococcal B Vaccine  Aged Out   COVID-19 Vaccine  Discontinued    Health Maintenance  Health Maintenance Due  Topic Date Due   INFLUENZA VACCINE  05/03/2024   Health Maintenance Items Addressed: Up to date  Additional Screening:  Vision Screening: Recommended annual ophthalmology exams for early detection of glaucoma and other disorders of the eye. Would you like a referral to an eye doctor? No    Dental Screening: Recommended annual dental exams for proper oral hygiene  Community Resource Referral / Chronic Care Management: CRR required this visit?  No   CCM required this visit?  No   Plan:    I have personally reviewed and noted the following in the  patient's chart:   Medical and social history Use of alcohol, tobacco or illicit drugs  Current medications and supplements including opioid prescriptions. Patient is not currently taking opioid prescriptions. Functional ability and status Nutritional status Physical activity Advanced directives List of other physicians Hospitalizations, surgeries, and ER visits in previous 12 months Vitals Screenings to include cognitive, depression, and falls Referrals and appointments  In addition, I have reviewed and discussed with patient certain preventive protocols, quality metrics, and best practice recommendations. A written personalized care plan for preventive services as well as general preventive health recommendations were provided to patient.   Ardella FORBES Dawn, LPN   1/77/7974   After Visit Summary: (MyChart) Due to this being a telephonic visit, the after visit summary with patients personalized plan was offered to patient via MyChart   Notes: Nothing significant to report at this time.

## 2024-06-27 ENCOUNTER — Other Ambulatory Visit: Payer: Self-pay | Admitting: Family Medicine

## 2024-06-27 DIAGNOSIS — I10 Essential (primary) hypertension: Secondary | ICD-10-CM

## 2024-07-11 DIAGNOSIS — L820 Inflamed seborrheic keratosis: Secondary | ICD-10-CM | POA: Diagnosis not present

## 2024-07-11 DIAGNOSIS — C44519 Basal cell carcinoma of skin of other part of trunk: Secondary | ICD-10-CM | POA: Diagnosis not present

## 2024-07-11 DIAGNOSIS — L720 Epidermal cyst: Secondary | ICD-10-CM | POA: Diagnosis not present

## 2024-07-11 DIAGNOSIS — D485 Neoplasm of uncertain behavior of skin: Secondary | ICD-10-CM | POA: Diagnosis not present

## 2024-07-11 DIAGNOSIS — L538 Other specified erythematous conditions: Secondary | ICD-10-CM | POA: Diagnosis not present

## 2024-07-11 DIAGNOSIS — D1801 Hemangioma of skin and subcutaneous tissue: Secondary | ICD-10-CM | POA: Diagnosis not present

## 2024-07-11 DIAGNOSIS — Z85828 Personal history of other malignant neoplasm of skin: Secondary | ICD-10-CM | POA: Diagnosis not present

## 2024-07-11 DIAGNOSIS — Z08 Encounter for follow-up examination after completed treatment for malignant neoplasm: Secondary | ICD-10-CM | POA: Diagnosis not present

## 2024-07-11 DIAGNOSIS — L821 Other seborrheic keratosis: Secondary | ICD-10-CM | POA: Diagnosis not present

## 2024-07-16 ENCOUNTER — Other Ambulatory Visit: Payer: Self-pay | Admitting: Family Medicine

## 2024-08-01 DIAGNOSIS — Z23 Encounter for immunization: Secondary | ICD-10-CM | POA: Diagnosis not present

## 2024-08-15 ENCOUNTER — Encounter: Payer: Self-pay | Admitting: Family Medicine

## 2024-08-15 ENCOUNTER — Ambulatory Visit: Payer: Self-pay | Admitting: Family Medicine

## 2024-08-15 ENCOUNTER — Ambulatory Visit: Admitting: Family Medicine

## 2024-08-15 VITALS — BP 150/82 | HR 76 | Temp 98.1°F | Ht 62.0 in | Wt 144.8 lb

## 2024-08-15 DIAGNOSIS — I10 Essential (primary) hypertension: Secondary | ICD-10-CM

## 2024-08-15 DIAGNOSIS — E782 Mixed hyperlipidemia: Secondary | ICD-10-CM

## 2024-08-15 LAB — BASIC METABOLIC PANEL WITH GFR
BUN: 13 mg/dL (ref 6–23)
CO2: 28 meq/L (ref 19–32)
Calcium: 10.4 mg/dL (ref 8.4–10.5)
Chloride: 104 meq/L (ref 96–112)
Creatinine, Ser: 0.89 mg/dL (ref 0.40–1.20)
GFR: 58.95 mL/min — ABNORMAL LOW (ref >60.00)
Glucose, Bld: 94 mg/dL (ref 70–99)
Potassium: 4.4 meq/L (ref 3.5–5.1)
Sodium: 141 meq/L (ref 135–145)

## 2024-08-15 LAB — LDL CHOLESTEROL, DIRECT: Direct LDL: 105 mg/dL

## 2024-08-15 MED ORDER — AMLODIPINE BESYLATE 10 MG PO TABS
10.0000 mg | ORAL_TABLET | Freq: Every day | ORAL | 1 refills | Status: AC
Start: 2024-08-15 — End: ?

## 2024-08-15 NOTE — Progress Notes (Signed)
 Established Patient Office Visit   Subjective:  Patient ID: Heidi Sanchez, female    DOB: March 31, 1941  Age: 83 y.o. MRN: [id number]  Chief Complaint  Patient presents with   Medical Management of Chronic Issues    6 mon f/u for HTN. No specific questions or concerns     HPI Encounter Diagnoses  Name Primary?   White coat syndrome with diagnosis of hypertension Yes   Mixed hyperlipidemia    For follow-up of above.  Blood pressures at home have been running a little higher up into the 140s over 80s on amlodipine  5 mg and lisinopril  20.  Continue simvastatin  40 mg for elevated cholesterol.  She has been stressed recently.  Status post dental crown and upcoming skin cancer surgery.   Review of Systems  Constitutional: Negative.   HENT: Negative.    Eyes:  Negative for blurred vision, discharge and redness.  Respiratory: Negative.    Cardiovascular: Negative.   Gastrointestinal:  Negative for abdominal pain.  Genitourinary: Negative.   Musculoskeletal: Negative.  Negative for myalgias.  Skin:  Negative for rash.  Neurological:  Negative for tingling, loss of consciousness and weakness.  Endo/Heme/Allergies:  Negative for polydipsia.     Current Outpatient Medications:    amLODipine  (NORVASC ) 10 MG tablet, Take 1 tablet (10 mg total) by mouth daily., Disp: 90 tablet, Rfl: 1   Cholecalciferol (VITAMIN D ) 50 MCG (2000 UT) CAPS, Take 2,000 Units by mouth daily., Disp: , Rfl:    diphenhydrAMINE  (BENADRYL ) 25 MG tablet, Take 25 mg by mouth as needed., Disp: , Rfl:    fluticasone  (FLONASE ) 50 MCG/ACT nasal spray, SPRAY 2 SPRAYS INTO EACH NOSTRIL EVERY DAY, Disp: 48 mL, Rfl: 2   lisinopril  (ZESTRIL ) 20 MG tablet, TAKE 1 TABLET BY MOUTH EVERY DAY, Disp: 90 tablet, Rfl: 0   Multiple Vitamins-Minerals (OCUVITE PRESERVISION) TABS, 1 tablet 2 (two) times daily. Use as directed, Disp: , Rfl:    Omega-3 Fatty Acids (FISH OIL ) 1000 MG CAPS, Take 1 capsule (1,000 mg total) by mouth daily.,  Disp: 90 capsule, Rfl: 3   simvastatin  (ZOCOR ) 40 MG tablet, TAKE 1 TABLET BY MOUTH EVERYDAY AT BEDTIME, Disp: 90 tablet, Rfl: 3   Objective:     BP (!) 158/88   Pulse 83   Temp 98.1 F (36.7 C)   Ht 5' 2 (1.575 m)   Wt 144 lb 12.8 oz (65.7 kg)   SpO2 97%   BMI 26.48 kg/m    Physical Exam Constitutional:      General: She is not in acute distress.    Appearance: Normal appearance. She is not ill-appearing, toxic-appearing or diaphoretic.  HENT:     Head: Normocephalic and atraumatic.     Right Ear: External ear normal.     Left Ear: External ear normal.  Eyes:     General: No scleral icterus.       Right eye: No discharge.        Left eye: No discharge.     Extraocular Movements: Extraocular movements intact.     Conjunctiva/sclera: Conjunctivae normal.  Pulmonary:     Effort: Pulmonary effort is normal. No respiratory distress.  Musculoskeletal:       Legs:  Skin:    General: Skin is warm and dry.  Neurological:     Mental Status: She is alert and oriented to person, place, and time.  Psychiatric:        Mood and Affect: Mood normal.  Behavior: Behavior normal.      No results found for any visits on 08/15/24.    The ASCVD Risk score (Arnett DK, et al., 2019) failed to calculate for the following reasons:   The 2019 ASCVD risk score is only valid for ages 68 to 8    Assessment & Plan:   White coat syndrome with diagnosis of hypertension -     Basic metabolic panel with GFR -     amLODIPine  Besylate; Take 1 tablet (10 mg total) by mouth daily.  Dispense: 90 tablet; Refill: 1  Mixed hyperlipidemia -     LDL cholesterol, direct    Return in about 6 weeks (around 09/26/2024).  Have increased amlodipine  to 10 mg daily.  Continue lisinopril  and simvastatin .  Information on managing hypertension given.  Elsie Sim Lent, MD

## 2024-08-22 DIAGNOSIS — C44519 Basal cell carcinoma of skin of other part of trunk: Secondary | ICD-10-CM | POA: Diagnosis not present

## 2024-09-21 ENCOUNTER — Other Ambulatory Visit: Payer: Self-pay | Admitting: Family Medicine

## 2024-09-21 DIAGNOSIS — I10 Essential (primary) hypertension: Secondary | ICD-10-CM

## 2024-10-01 ENCOUNTER — Ambulatory Visit (INDEPENDENT_AMBULATORY_CARE_PROVIDER_SITE_OTHER): Admitting: Family Medicine

## 2024-10-01 ENCOUNTER — Encounter: Payer: Self-pay | Admitting: Family Medicine

## 2024-10-01 VITALS — BP 141/74 | HR 78 | Temp 97.3°F | Ht 62.0 in | Wt 148.8 lb

## 2024-10-01 DIAGNOSIS — I10 Essential (primary) hypertension: Secondary | ICD-10-CM | POA: Diagnosis not present

## 2024-10-01 DIAGNOSIS — E782 Mixed hyperlipidemia: Secondary | ICD-10-CM

## 2024-10-01 NOTE — Progress Notes (Signed)
 "  Established Patient Office Visit   Subjective:  Patient ID: Heidi Sanchez, female    DOB: 1941-08-02  Age: 83 y.o. MRN: [id number]  Chief Complaint  Patient presents with   Medical Management of Chronic Issues    6 mon f/u for HTN,     HPI Encounter Diagnoses  Name Primary?   White coat syndrome with diagnosis of hypertension Yes   Mixed hyperlipidemia    For follow-up of hypertension status post increasing amlodipine  from 5 mg to 10 mg.  Doing well with the higher dose.  Blood pressures at home running in the 100s to 120s over 60-70.  Denies lightheadedness, headaches or blurred vision.  She is taking her simvastatin  daily.   Review of Systems  Constitutional: Negative.   HENT: Negative.    Eyes:  Negative for blurred vision, discharge and redness.  Respiratory: Negative.    Cardiovascular: Negative.   Gastrointestinal:  Negative for abdominal pain.  Genitourinary: Negative.   Musculoskeletal: Negative.  Negative for myalgias.  Skin:  Negative for rash.  Neurological:  Negative for tingling, loss of consciousness and weakness.  Endo/Heme/Allergies:  Negative for polydipsia.    Current Medications[1]   Objective:     BP (!) 141/74   Pulse 78   Temp (!) 97.3 F (36.3 C)   Ht 5' 2 (1.575 m)   Wt 148 lb 12.8 oz (67.5 kg)   SpO2 98%   BMI 27.22 kg/m  BP Readings from Last 3 Encounters:  10/01/24 (!) 141/74  08/15/24 (!) 150/82  02/27/24 124/68   Wt Readings from Last 3 Encounters:  10/01/24 148 lb 12.8 oz (67.5 kg)  08/15/24 144 lb 12.8 oz (65.7 kg)  02/27/24 143 lb 6.4 oz (65 kg)      Physical Exam Constitutional:      General: She is not in acute distress.    Appearance: Normal appearance. She is not ill-appearing, toxic-appearing or diaphoretic.  HENT:     Head: Normocephalic and atraumatic.     Right Ear: External ear normal.     Left Ear: External ear normal.  Eyes:     General: No scleral icterus.       Right eye: No discharge.        Left  eye: No discharge.     Extraocular Movements: Extraocular movements intact.     Conjunctiva/sclera: Conjunctivae normal.  Pulmonary:     Effort: Pulmonary effort is normal. No respiratory distress.  Skin:    General: Skin is warm and dry.  Neurological:     Mental Status: She is alert and oriented to person, place, and time.  Psychiatric:        Mood and Affect: Mood normal.        Behavior: Behavior normal.      No results found for any visits on 10/01/24.    The ASCVD Risk score (Arnett DK, et al., 2019) failed to calculate for the following reasons:   The 2019 ASCVD risk score is only valid for ages 69 to 77   * - Cholesterol units were assumed    Assessment & Plan:   White coat syndrome with diagnosis of hypertension  Mixed hyperlipidemia    Return in about 6 months (around 04/01/2025), or Continue current medications.Heidi Elsie Sim Berneta, MD    [1]  Current Outpatient Medications:    amLODipine  (NORVASC ) 10 MG tablet, Take 1 tablet (10 mg total) by mouth daily., Disp: 90 tablet, Rfl: 1  Cholecalciferol (VITAMIN D ) 50 MCG (2000 UT) CAPS, Take 2,000 Units by mouth daily., Disp: , Rfl:    diphenhydrAMINE  (BENADRYL ) 25 MG tablet, Take 25 mg by mouth as needed., Disp: , Rfl:    fluticasone  (FLONASE ) 50 MCG/ACT nasal spray, SPRAY 2 SPRAYS INTO EACH NOSTRIL EVERY DAY, Disp: 48 mL, Rfl: 2   lisinopril  (ZESTRIL ) 20 MG tablet, TAKE 1 TABLET BY MOUTH EVERY DAY, Disp: 90 tablet, Rfl: 0   Multiple Vitamins-Minerals (OCUVITE PRESERVISION) TABS, 1 tablet 2 (two) times daily. Use as directed, Disp: , Rfl:    Omega-3 Fatty Acids (FISH OIL ) 1000 MG CAPS, Take 1 capsule (1,000 mg total) by mouth daily., Disp: 90 capsule, Rfl: 3   simvastatin  (ZOCOR ) 40 MG tablet, TAKE 1 TABLET BY MOUTH EVERYDAY AT BEDTIME, Disp: 90 tablet, Rfl: 3  "

## 2025-04-01 ENCOUNTER — Ambulatory Visit: Admitting: Family Medicine

## 2025-05-30 ENCOUNTER — Ambulatory Visit
